# Patient Record
Sex: Female | Born: 1955
Health system: Southern US, Community
[De-identification: ages and names within clinical notes are randomized; demographics above are authoritative.]

## PROBLEM LIST (undated history)

## (undated) DIAGNOSIS — M1990 Unspecified osteoarthritis, unspecified site: Secondary | ICD-10-CM

## (undated) DIAGNOSIS — E8881 Metabolic syndrome: Secondary | ICD-10-CM

## (undated) DIAGNOSIS — I10 Essential (primary) hypertension: Secondary | ICD-10-CM

## (undated) DIAGNOSIS — R7303 Prediabetes: Secondary | ICD-10-CM

## (undated) DIAGNOSIS — E785 Hyperlipidemia, unspecified: Secondary | ICD-10-CM

## (undated) DIAGNOSIS — T7840XA Allergy, unspecified, initial encounter: Secondary | ICD-10-CM

## (undated) DIAGNOSIS — A6004 Herpesviral vulvovaginitis: Secondary | ICD-10-CM

## (undated) DIAGNOSIS — R55 Syncope and collapse: Principal | ICD-10-CM

## (undated) DIAGNOSIS — M7731 Calcaneal spur, right foot: Secondary | ICD-10-CM

## (undated) DIAGNOSIS — J309 Allergic rhinitis, unspecified: Secondary | ICD-10-CM

## (undated) DIAGNOSIS — M8580 Other specified disorders of bone density and structure, unspecified site: Secondary | ICD-10-CM

## (undated) DIAGNOSIS — M722 Plantar fascial fibromatosis: Secondary | ICD-10-CM

## (undated) HISTORY — DX: Herpesviral vulvovaginitis: A60.04

## (undated) HISTORY — DX: Allergic rhinitis, unspecified: J30.9

## (undated) HISTORY — PX: OTHER SURGICAL HISTORY: SHX169

## (undated) HISTORY — PX: COLONOSCOPY: SHX174

## (undated) HISTORY — DX: Other specified disorders of bone density and structure, unspecified site: M85.80

## (undated) HISTORY — DX: Allergy, unspecified, initial encounter: T78.40XA

## (undated) HISTORY — DX: Calcaneal spur, right foot: M77.31

## (undated) HISTORY — PX: ABDOMINAL HYSTERECTOMY: SHX81

## (undated) HISTORY — DX: Unspecified osteoarthritis, unspecified site: M19.90

## (undated) HISTORY — DX: Prediabetes: R73.03

## (undated) HISTORY — DX: Plantar fascial fibromatosis: M72.2

## (undated) HISTORY — DX: Syncope and collapse: R55

## (undated) HISTORY — DX: Metabolic syndrome: E88.81

## (undated) HISTORY — PX: JOINT REPLACEMENT: SHX530

## (undated) HISTORY — DX: Hyperlipidemia, unspecified: E78.5

## (undated) HISTORY — PX: TOTAL HIP ARTHROPLASTY: SHX124

## (undated) HISTORY — DX: Metabolic syndrome: E88.810

## (undated) HISTORY — DX: Essential (primary) hypertension: I10

---

## 1997-12-23 ENCOUNTER — Other Ambulatory Visit: Admission: RE | Admit: 1997-12-23 | Discharge: 1997-12-23 | Payer: Self-pay | Admitting: Obstetrics and Gynecology

## 1998-12-30 ENCOUNTER — Other Ambulatory Visit: Admission: RE | Admit: 1998-12-30 | Discharge: 1998-12-30 | Payer: Self-pay | Admitting: Obstetrics and Gynecology

## 2001-02-08 ENCOUNTER — Emergency Department (HOSPITAL_COMMUNITY): Admission: EM | Admit: 2001-02-08 | Discharge: 2001-02-08 | Payer: Self-pay | Admitting: Emergency Medicine

## 2001-05-28 ENCOUNTER — Other Ambulatory Visit: Admission: RE | Admit: 2001-05-28 | Discharge: 2001-05-28 | Payer: Self-pay | Admitting: Obstetrics and Gynecology

## 2005-10-25 ENCOUNTER — Ambulatory Visit (HOSPITAL_COMMUNITY): Admission: RE | Admit: 2005-10-25 | Discharge: 2005-10-26 | Payer: Self-pay | Admitting: Obstetrics & Gynecology

## 2005-10-25 ENCOUNTER — Encounter (INDEPENDENT_AMBULATORY_CARE_PROVIDER_SITE_OTHER): Payer: Self-pay | Admitting: Specialist

## 2006-03-03 ENCOUNTER — Encounter: Admission: RE | Admit: 2006-03-03 | Discharge: 2006-03-03 | Payer: Self-pay | Admitting: Orthopedic Surgery

## 2006-05-23 ENCOUNTER — Encounter: Admission: RE | Admit: 2006-05-23 | Discharge: 2006-05-23 | Payer: Self-pay | Admitting: Internal Medicine

## 2006-10-03 ENCOUNTER — Encounter: Admission: RE | Admit: 2006-10-03 | Discharge: 2007-01-01 | Payer: Self-pay | Admitting: Internal Medicine

## 2007-05-14 ENCOUNTER — Emergency Department (HOSPITAL_COMMUNITY): Admission: EM | Admit: 2007-05-14 | Discharge: 2007-05-14 | Payer: Self-pay | Admitting: Emergency Medicine

## 2008-02-19 ENCOUNTER — Ambulatory Visit: Payer: Self-pay | Admitting: Internal Medicine

## 2008-03-24 ENCOUNTER — Ambulatory Visit: Payer: Self-pay | Admitting: Internal Medicine

## 2008-04-16 ENCOUNTER — Encounter: Admission: RE | Admit: 2008-04-16 | Discharge: 2008-04-16 | Payer: Self-pay | Admitting: Allergy and Immunology

## 2008-06-09 ENCOUNTER — Ambulatory Visit: Payer: Self-pay | Admitting: Internal Medicine

## 2008-12-30 ENCOUNTER — Ambulatory Visit: Payer: Self-pay | Admitting: Internal Medicine

## 2009-01-16 ENCOUNTER — Ambulatory Visit (HOSPITAL_COMMUNITY): Admission: RE | Admit: 2009-01-16 | Discharge: 2009-01-16 | Payer: Self-pay | Admitting: Orthopedic Surgery

## 2009-01-20 ENCOUNTER — Ambulatory Visit: Payer: Self-pay | Admitting: Internal Medicine

## 2009-03-09 ENCOUNTER — Ambulatory Visit: Payer: Self-pay | Admitting: Internal Medicine

## 2010-01-01 ENCOUNTER — Ambulatory Visit: Payer: Self-pay | Admitting: Internal Medicine

## 2010-07-21 ENCOUNTER — Emergency Department (HOSPITAL_COMMUNITY): Payer: Managed Care, Other (non HMO)

## 2010-07-21 ENCOUNTER — Emergency Department (HOSPITAL_COMMUNITY)
Admission: EM | Admit: 2010-07-21 | Discharge: 2010-07-21 | Disposition: A | Discharge status: Home / Self Care | Payer: Managed Care, Other (non HMO) | Attending: Emergency Medicine | Admitting: Emergency Medicine

## 2010-07-21 DIAGNOSIS — E785 Hyperlipidemia, unspecified: Secondary | ICD-10-CM | POA: Insufficient documentation

## 2010-07-21 DIAGNOSIS — R079 Chest pain, unspecified: Secondary | ICD-10-CM | POA: Insufficient documentation

## 2010-07-21 DIAGNOSIS — M25519 Pain in unspecified shoulder: Secondary | ICD-10-CM | POA: Insufficient documentation

## 2010-07-21 DIAGNOSIS — R61 Generalized hyperhidrosis: Secondary | ICD-10-CM | POA: Insufficient documentation

## 2010-07-21 DIAGNOSIS — M542 Cervicalgia: Secondary | ICD-10-CM | POA: Insufficient documentation

## 2010-07-21 DIAGNOSIS — R110 Nausea: Secondary | ICD-10-CM | POA: Insufficient documentation

## 2010-07-21 LAB — BASIC METABOLIC PANEL
BUN: 19 mg/dL (ref 6–23)
GFR calc Af Amer: >60 mL/min (ref >60)
GFR calc non Af Amer: >60 mL/min (ref >60)
Potassium: 3.8 mEq/L (ref 3.5–5.1)
Sodium: 135 mEq/L (ref 135–145)

## 2010-07-21 LAB — POCT CARDIAC MARKERS
CKMB, poc: 1.8 ng/mL (ref 1.0–8.0)
Troponin i, poc: <0.05 ng/mL (ref 0.00–0.09)

## 2010-07-21 LAB — CBC
HCT: 39.5 % (ref 36.0–46.0)
Hemoglobin: 12.8 g/dL (ref 12.0–15.0)
MCV: 91.6 fL (ref 78.0–100.0)
Platelets: 313 10*3/uL (ref 150–400)
RBC: 4.31 MIL/uL (ref 3.87–5.11)
WBC: 11 10*3/uL — ABNORMAL HIGH (ref 4.0–10.5)

## 2010-07-21 LAB — DIFFERENTIAL
Lymphocytes Relative: 38 % (ref 12–46)
Lymphs Abs: 4.2 10*3/uL — ABNORMAL HIGH (ref 0.7–4.0)
Neutro Abs: 5.6 10*3/uL (ref 1.7–7.7)
Neutrophils Relative %: 50 % (ref 43–77)

## 2010-07-27 ENCOUNTER — Encounter (INDEPENDENT_AMBULATORY_CARE_PROVIDER_SITE_OTHER): Payer: Managed Care, Other (non HMO) | Admitting: Internal Medicine

## 2010-07-27 DIAGNOSIS — E785 Hyperlipidemia, unspecified: Secondary | ICD-10-CM

## 2010-07-27 DIAGNOSIS — Z23 Encounter for immunization: Secondary | ICD-10-CM

## 2010-07-27 DIAGNOSIS — E559 Vitamin D deficiency, unspecified: Secondary | ICD-10-CM

## 2010-07-27 DIAGNOSIS — E119 Type 2 diabetes mellitus without complications: Secondary | ICD-10-CM

## 2010-08-13 LAB — COMPREHENSIVE METABOLIC PANEL
AST: 23 U/L (ref 0–37)
Albumin: 4.2 g/dL (ref 3.5–5.2)
Alkaline Phosphatase: 100 U/L (ref 39–117)
BUN: 12 mg/dL (ref 6–23)
Chloride: 100 mEq/L (ref 96–112)
Potassium: 4 mEq/L (ref 3.5–5.1)
Total Bilirubin: 0.4 mg/dL (ref 0.3–1.2)

## 2010-08-13 LAB — CBC
HCT: 39.3 % (ref 36.0–46.0)
Platelets: 340 10*3/uL (ref 150–400)
WBC: 11.4 10*3/uL — ABNORMAL HIGH (ref 4.0–10.5)

## 2010-08-13 LAB — URINALYSIS, ROUTINE W REFLEX MICROSCOPIC
Bilirubin Urine: NEGATIVE
Glucose, UA: NEGATIVE mg/dL
Specific Gravity, Urine: 1.023 (ref 1.005–1.030)
pH: 5.5 (ref 5.0–8.0)

## 2010-08-13 LAB — PROTIME-INR: INR: 0.9 (ref 0.00–1.49)

## 2010-08-13 LAB — URINE MICROSCOPIC-ADD ON

## 2010-09-13 ENCOUNTER — Ambulatory Visit (INDEPENDENT_AMBULATORY_CARE_PROVIDER_SITE_OTHER): Payer: Managed Care, Other (non HMO) | Admitting: Internal Medicine

## 2010-09-13 DIAGNOSIS — L259 Unspecified contact dermatitis, unspecified cause: Secondary | ICD-10-CM

## 2010-09-13 DIAGNOSIS — J069 Acute upper respiratory infection, unspecified: Secondary | ICD-10-CM

## 2010-09-21 ENCOUNTER — Telehealth: Payer: Self-pay

## 2010-09-21 NOTE — Telephone Encounter (Signed)
Rx: Hycodan 8 oz one tsp po Q6 hours prn cough called in to [facility] per MD order.

## 2010-09-21 NOTE — Telephone Encounter (Signed)
Please call in Hycodan 8 0z one tsp po q 6 hours prn cough

## 2010-09-24 NOTE — Discharge Summary (Signed)
NAMESHAKHIA, Carla [name]               ACCOUNT NO.:  [id number]   MEDICAL RECORD NO.:  [id number]          PATIENT TYPE:  OIB   LOCATION:  9302                          FACILITY:  [facility]   PHYSICIAN:  Freddy Finner, M.D.   DATE OF BIRTH:  Aug 08, 1955   DATE OF ADMISSION:  10/25/2005  DATE OF DISCHARGE:  10/26/2005                                 DISCHARGE SUMMARY   DISCHARGE DIAGNOSES:  1.  Uterine leiomyomata.  2.  Complex left adnexal mass, pathology pending.  3.  Endometrial polyp, pathology pending.   OPERATIVE PROCEDURE:  Laparoscopic-assisted vaginal hysterectomy, bilateral  salpingo-oophorectomy.   INTRAOPERATIVE/POSTOPERATIVE COMPLICATIONS:  None.   DISPOSITION:  Patient is in satisfactory, improved condition at the time of  her discharge.  She is to have progressively increasing physical activity  but no vaginal entry, no heavy lifting.  She is to report fever, severe  pain, or heavy bleeding.   DISCHARGE MEDICATIONS:  Motrin 600 mg every 6 hours as needed for  postoperative pain, which can be used along with Darvocet-N 100 1-2 every 4-  6 hours as needed for postoperative pain.  She is to continue with her  Vivelle transdermal estrogen system, which is 0.05 mg.   She is to return to the office in two weeks for postoperative followup.   She is to avoid vaginal entry, heavy lifting, or heavy physical work.  She  is to report fever, severe pain, or heavy bleeding.   For details of the present of the illness, past obstetric history, review of  systems, physical exam, refer to the admission note.   Her physical findings on admission were primarily remarkable for fibroids  and for ultrasound findings which were documented in the present illness.   Laboratory data during this admission includes an admission hemoglobin of  12.6, postoperative hemoglobin of 10.7, normal prothrombin time, PTT, and  INR on admission.   HOSPITAL COURSE:  The patient was admitted on the morning of  surgery,  treated perioperatively with IV antibiotics and with antiembolic compression  hose.  The above-described surgical procedure was accomplished without  difficulty or intraoperative complications.  Her postoperative course was  uncomplicated.  She remained  afebrile throughout her postoperative stay.  By the evening of the first  postoperative day, she was ambulating without difficulty.  She was having  adequate bowel and bladder function.  Her incisions were clean, dry, and  intact.  She was discharged home with disposition as noted above.      Freddy Finner, M.D.  Electronically Signed     WRN/MEDQ  D:  10/26/2005  T:  10/26/2005  Job:  [id number]

## 2010-09-24 NOTE — H&P (Signed)
NAMEMarland Kitchen  Carla [name], Carla [name] NO.:  [id number]   MEDICAL RECORD NO.:  [id number]         PATIENT TYPE:  WOIB   LOCATION:                                FACILITY:  [facility]   PHYSICIAN:  Freddy Finner, M.D.        DATE OF BIRTH:   DATE OF ADMISSION:  DATE OF DISCHARGE:                                HISTORY & PHYSICAL   ADMISSION DIAGNOSES:  1.  Uterine fibroids.  2.  Persistent complex left adnexal mass, most likely dermoid cystic ovary.  3.  Endometrial polyp diagnosed by sonohistogram.   The patient is a 55 year old white married female nulligravida, who  presented to the office first in September 2006 with a history of irregular  cycles, prolonged intramenstrual spotting.  Subsequent to that examination,  she had a sonohistogram in the office which revealed a small 7 mm  endometrial polyp and numerous uterine leiomyomata, the largest measuring  approximately 3.5 cm.  In addition, TSH was obtained showing a normal  thyroid.  Her pelvic examination revealed mild enlargement of her uterus on  initial exam, and this was confirmed by ultrasound.  Over the course of that  time, options of therapy had been discussed  with her, and she has elected  to proceed with definitive surgery, specifically, laparoscopic-assisted  vaginal hysterectomy and bilateral salpingo-oophorectomy.  The option of  hysteroscopy D&C  and NovaSure endometrial ablation was offered and refused.   REVIEW OF SYSTEMS:  Her current Review of Systems is otherwise negative.  No  cardiopulmonary, GI, GU complaints.   PAST MEDICAL HISTORY:  The patient is noted to have irritable bowel  syndrome, has had recent negative colonoscopy with Dr. Ritta Slot.   PREVIOUS OPERATIVE PROCEDURES:  Include left hip replacement in 1999.  She  had hysteroscopy D&C in 2001.  No other operative procedures.   Her only drug allergy is CODEINE.   Last known mammogram was normal in December 2005.  Last known Pap test was  normal in October 2005.  No history of abnormal Pap.   FAMILY HISTORY:  Noncontributory.   PHYSICAL EXAMINATION:  HEENT: Grossly within normal limits.  NECK:  Thyroid gland is not palpably enlarged.  VITAL SIGNS:  Blood pressure in the office was 124/74.  The patient is obese  at 5 feet 1-3/4 inches and 180 pounds.  CHEST: Clear to auscultation.  HEART: Normal sinus rhythm without murmur, rub, or gallop.  BREASTS:  Exam is normal.  No dominant masses, no skin changes, no  retractions.  ABDOMEN: Soft and nontender.  There is no appreciable organomegaly.  EXTREMITIES:  Without cyanosis, clubbing, or edema.  PELVIC: External genitalia, vagina, and cervix are normal to inspection.  Bimanual reveals uterus to be anterior and 12 to 14-week size.  There are no  palpable adnexal masses. Exam is confirmed by body habitus and size of the  uterus.  RECTAL:  Rectum is palpably normal.  Rectovaginal exam confirms the above  findings.   IMPRESSION:  1.  Numerous uterine leiomyomata  2.  Dysfunctional uterine bleeding.  3.  Endometrial polyp.  4.  Complex left adnexal mass.      Freddy Finner, M.D.  Electronically Signed     WRN/MEDQ  D:  10/24/2005  T:  10/24/2005  Job:  [id number]

## 2010-09-24 NOTE — Op Note (Signed)
NAMEANAB, VIVAR               ACCOUNT NO.:  [id number]   MEDICAL RECORD NO.:  [id number]          PATIENT TYPE:  OIB   LOCATION:  9302                          FACILITY:  [facility]   PHYSICIAN:  Freddy Finner, M.D.   DATE OF BIRTH:  [birth year]   DATE OF PROCEDURE:  10/25/2005  DATE OF DISCHARGE:                                 OPERATIVE REPORT   PREOPERATIVE DIAGNOSES:  Fibroids, endometrial polyp, dysfunction uterine  bleeding.   POSTOPERATIVE DIAGNOSES:  Fibroids, endometrial polyp, dysfunction uterine  bleeding.   OPERATIVE PROCEDURE:  Laparoscopically assisted vaginal hysterectomy,  bilateral salpingo-oophorectomy.   SURGEON:  Freddy Finner, M.D.   ASSISTANT:  Stann Mainland. Vincente Poli, M.D.   ESTIMATED INTRAOPERATIVE BLOOD LOSS:  200 cc.   ANESTHESIA:  General endotracheal.   COMPLICATIONS:  None.   The details of the present illness are recorded in the admission note.  The  patient was admitted on the morning of surgery.  She was given an IV  antibiotic bolus preoperatively.  She was placed in antiembolic compression  hose.  She was brought to the operating room and placed under adequate  general anesthesia, placed in the dorsal lithotomy position with care to  control the abduction of her hips for the previous hip replacement.  The  abdomen, perineum, and vagina were prepped with Betadine scrub, followed by  Betadine solution.  The bladder was evacuated with a Robinson catheter with  a sterile technique.  A Hulka tenaculum was attached to the cervix under  direct visualization.  Sterile drapes were applied.  Two small incisions  were made, one at the umbilicus and one just above the symphysis.  Through  the upper incision, an 11 mm nonbloody disposable trocar was introduced,  following the anterior abdominal wall manually.  Direct inspection revealed  adequate placement with no evidence of injury on entry.  Pneumoperitoneum  was allowed to accumulate with carbon dioxide  gas.  A 5 mm trocar was placed  under direct visualization through the lower incision.  Through it, spring-  loaded grasping forceps, a blunt probe, and later the Nezhat irrigation  system was used.  Systemic examination of pelvic and abdominal contents was  carried out.  The uterus was significantly enlarged and irregular nodule,  consistent with preoperative ultrasound findings.  Tubes and ovaries appear  to be normal.  The complex adnexal cyst previously noted was not easily  visible, and the ovaries were left intact during the dissection.  The  appendix was partially visualized and appeared to be retrocecal but normal.  There was no apparently abnormality of the upper abdomen.  The tripolar  gyrus device was then used through the operating channel of the laparoscope.  The infundibulopelvic ligament, round ligament, upper broad ligament was  progressively developed, sealed, and divided using the bipolar coagulation  and the blade in the device.  This dissection was carried basically down to  the level of the uterine arteries.  The bladder was released anteriorly from  the cervix.  The cervix and uterus were somewhat distorted on the lower  segment because of the  presence of fibroids.  Close dissection along the  margin was successful without injury to other structures.  Attention was  then turned vaginally.  The patient is nulliparous.  The vault was very  narrow.  Using appropriate Deavers and the __________ slender posterior  weighted retractor.  The cul de sac was entered to search, was circumscribed  with a scalpel, and using the gyrus, a Heaney-style clamp, progressive  pedicles were developed with bipolar coagulation and sharp division.  The  uterosacrals were taken.  Bladder pillars were taken.  The bladder was very  carefully advanced further off the cervix.  The cardinal ligament pedicles  were taken, sealed, and divided, as were the uterine artery pedicles.  This  allowed  delivery of the uterus through the vaginal introitus without further  significant difficulty.  There was no active bleeding except from the cuff.  Angles of the vagina were anchored to the uterosacrals with a mattress  suture of 0 Monocryl.  Uterosacrals were plicated, and posterior peritoneum  were closed with an interrupted 0 Monocryl suture.  The cuff was carefully  closed with figure-of-eights of 0 Monocryl in a vertical direction.  A Foley  catheter was placed.  Indigo carmine had been given IV, and there was prompt  drainage of a large amount of bloody urine.  Spillage of urine was noted,  either vaginally on reinspection, and laparoscopically.  Reinspection  laparoscopically was carried out using a Nezhat system, and complete  hemostasis was noted.  Photographs were made before and after removal of the  uterus and were retained in the office records.  The irrigating solution was  aspirated from the abdomen.  Hemostasis remained complete under reduced  intra-abdominal pressure.  The gas was allowed to escape from the abdomen.  All instruments were removed.  The skin incisions were anesthetized with  0.25% plain Marcaine.  The skin incisions were closed with interrupted  subcuticular sutures with 3-0 Dexon.  Steri-Strips were applied to the lower  incision.  The patient tolerated the procedure well.  She was awakened and  taken to recovery in good condition.      Freddy Finner, M.D.  Electronically Signed     WRN/MEDQ  D:  10/25/2005  T:  10/25/2005  Job:  [id number]

## 2010-10-27 LAB — HM COLONOSCOPY

## 2010-11-01 ENCOUNTER — Ambulatory Visit: Payer: Managed Care, Other (non HMO) | Admitting: Internal Medicine

## 2010-11-23 ENCOUNTER — Encounter: Payer: Self-pay | Admitting: Internal Medicine

## 2010-11-23 ENCOUNTER — Ambulatory Visit (INDEPENDENT_AMBULATORY_CARE_PROVIDER_SITE_OTHER): Payer: Managed Care, Other (non HMO) | Admitting: Internal Medicine

## 2010-11-23 DIAGNOSIS — M1990 Unspecified osteoarthritis, unspecified site: Secondary | ICD-10-CM

## 2010-11-23 DIAGNOSIS — E669 Obesity, unspecified: Secondary | ICD-10-CM

## 2010-11-23 DIAGNOSIS — E785 Hyperlipidemia, unspecified: Secondary | ICD-10-CM

## 2010-11-23 DIAGNOSIS — E119 Type 2 diabetes mellitus without complications: Secondary | ICD-10-CM

## 2010-11-23 DIAGNOSIS — J069 Acute upper respiratory infection, unspecified: Secondary | ICD-10-CM

## 2010-11-23 DIAGNOSIS — J309 Allergic rhinitis, unspecified: Secondary | ICD-10-CM

## 2010-11-23 DIAGNOSIS — E559 Vitamin D deficiency, unspecified: Secondary | ICD-10-CM

## 2010-11-23 MED ORDER — AZITHROMYCIN 250 MG PO TABS: ORAL_TABLET | ORAL | Status: AC

## 2010-12-04 ENCOUNTER — Encounter: Payer: Self-pay | Admitting: Internal Medicine

## 2010-12-04 DIAGNOSIS — E559 Vitamin D deficiency, unspecified: Secondary | ICD-10-CM | POA: Insufficient documentation

## 2010-12-04 DIAGNOSIS — E785 Hyperlipidemia, unspecified: Secondary | ICD-10-CM | POA: Insufficient documentation

## 2010-12-04 DIAGNOSIS — E119 Type 2 diabetes mellitus without complications: Secondary | ICD-10-CM | POA: Insufficient documentation

## 2010-12-04 DIAGNOSIS — J309 Allergic rhinitis, unspecified: Secondary | ICD-10-CM | POA: Insufficient documentation

## 2010-12-04 DIAGNOSIS — E669 Obesity, unspecified: Secondary | ICD-10-CM | POA: Insufficient documentation

## 2010-12-04 DIAGNOSIS — M1990 Unspecified osteoarthritis, unspecified site: Secondary | ICD-10-CM | POA: Insufficient documentation

## 2010-12-04 NOTE — Progress Notes (Signed)
  Subjective:    Patient ID: Carla [name], female    DOB: Sep 19, 1955, 55 y.o.   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 [place]. Yesterday came in with sore throat and respiratory congestion. Prior history of allergic rhinitis but she feels that this is an acute illness and she has a low-grade fever. Has malaise and fatigue. Has appointment soon for evaluation for surgical clearance for hip replacement. Currently diet controlled diabetes. Has not been able to lose weight because of orthopedic issues with exercise. Previous weight August 2010 was 193 pounds and in March 2012 was 183 pounds. In March, hemoglobin A1c was 6.6% and vitamin D level was 11. She was placed on high-dose vitamin D 50,000 units weekly for 12 weeks.    Review of Systems     Objective:   Physical Exam pharynx slightly injected. Sounds nasally congested. TMs slightly full bilaterally. Neck is supple without adenopathy or thyromegaly. Chest clear.        Assessment & Plan:   Upper respiratory infection likely viral etiology: Have her patient has diabetes  I'm going to give her Zithromax Z-Pak (6 tablets) 2 tablets day one followed by 1 tablet days 2 through 5. Samples of Zutipro cough syrup 1 teaspoon at bedtime as needed.

## 2010-12-06 ENCOUNTER — Encounter: Payer: Self-pay | Admitting: Internal Medicine

## 2010-12-06 ENCOUNTER — Ambulatory Visit (INDEPENDENT_AMBULATORY_CARE_PROVIDER_SITE_OTHER): Payer: Managed Care, Other (non HMO) | Admitting: Internal Medicine

## 2010-12-06 DIAGNOSIS — Z0000 Encounter for general adult medical examination without abnormal findings: Secondary | ICD-10-CM

## 2010-12-06 DIAGNOSIS — E785 Hyperlipidemia, unspecified: Secondary | ICD-10-CM

## 2010-12-06 DIAGNOSIS — E559 Vitamin D deficiency, unspecified: Secondary | ICD-10-CM

## 2010-12-06 DIAGNOSIS — E119 Type 2 diabetes mellitus without complications: Secondary | ICD-10-CM

## 2010-12-06 LAB — HEPATIC FUNCTION PANEL
AST: 18 U/L (ref 0–37)
Alkaline Phosphatase: 102 U/L (ref 39–117)
Bilirubin, Direct: 0.1 mg/dL (ref 0.0–0.3)
Total Bilirubin: 0.4 mg/dL (ref 0.3–1.2)

## 2010-12-06 MED ORDER — SITAGLIPTIN PHOSPHATE 100 MG PO TABS: 100.0000 mg | ORAL_TABLET | Freq: Every day | ORAL | Status: DC

## 2010-12-06 MED ORDER — MONTELUKAST SODIUM 10 MG PO TABS: 10.0000 mg | ORAL_TABLET | Freq: Every day | ORAL | Status: DC

## 2010-12-06 MED ORDER — ATORVASTATIN CALCIUM 20 MG PO TABS: 20.0000 mg | ORAL_TABLET | Freq: Every day | ORAL | Status: DC

## 2010-12-06 MED ORDER — RAMIPRIL 2.5 MG PO CAPS: 2.5000 mg | ORAL_CAPSULE | Freq: Every day | ORAL | Status: DC

## 2010-12-06 NOTE — Progress Notes (Signed)
Subjective:    Patient ID: Carla [name], female    DOB: 1955-06-20, 55 y.o.   MRN: [id number]  HPI  55 year old white female with history of diabetes mellitus facing right hip replacement surgery in early October. She is overweight. Finds it hard to exercise because of hip pain. Has run out of Januvia. History of allergic rhinitis for which she takes Singulair 10 mg daily. History of hyperlipidemia treated with Lipitor 20 mg daily. She has had Pneumovax immunization September 2010. Tetanus immunization February 2012. Discussed with her today noncompliance with diabetic management. Husband is also a diabetic. They like to snack at night. Blames her weight gain and lack of exercise but some of it is dietary indiscretion as well. Also history of vitamin D deficiency. She took high dose vitamin D supplement for a while but did not followup with daily vitamin D over-the-counter. Have recommended to thousand units daily over-the-counter. Needs to take calcium supplement as well.  History of HSV type II and condyloma acuminata. History of fractured right wrist 1990. History of partial phalangectomy right second toe 1990, endometrial polypectomy, D. and C. 1998; arthroscopic debridement of left hip spring 1999 [facility] left hip replacement November 1999. Total abdominal hysterectomy, BSO for fibroids and right dermoid cyst 2007. Right hip replacement 2010. Had colonoscopy by Dr. met often 2006. Bone density study in 2000 [place]. Gets annual influenza immunization. Occasionally uses Pulmicort but not on a daily basis 2 puffs by mouth twice daily. Sees Dr. Jennette Kettle for GYN care and gets mammogram through his office.  In March 2012, total cholesterol was 214, triglycerides 153, LDL cholesterol 130 on Lipitor 20 mg nightly. TSH was normal. Hemoglobin A1c was 6.6%. Vitamin D level was 11 and that is when she was placed on 12 weeks of high-dose vitamin D therapy. Vitamin D level will be repeated  today along with hemoglobin A1c and fasting lipid panel.  Discussed with her today taking small dose of Altase 2.5 mg daily for renal protection with history of diabetes mellitus. Her blood pressures under good control. Urine needs to be sent for microalbumin. Reminded about diabetic eye exam.  Had episode of chest pain seen in emergency department March 2012. Used to wear Vivelle patch per GYN physician but has stopped this.  Does not smoke. Social alcohol consumption.  Father died with complications of Parkinson's disease. Mother in fairly good health. One brother in good health. Half sister in good health.    Review of Systems  Constitutional: Negative.   HENT: Negative.   Eyes: Negative.   Respiratory: Negative.   Cardiovascular: Negative.   Gastrointestinal: Negative.   Genitourinary: Negative.   Musculoskeletal: Negative.   Neurological: Negative.   Hematological: Negative.   Psychiatric/Behavioral: Negative.        Objective:   Physical Exam  Constitutional: She is oriented to person, place, and time.  HENT:  Head: Normocephalic and atraumatic.  Right Ear: External ear normal.  Left Ear: External ear normal.  Mouth/Throat: Oropharynx is clear and moist. No oropharyngeal exudate.  Eyes: EOM are normal. Pupils are equal, round, and reactive to light. Right eye exhibits no discharge. Left eye exhibits no discharge. No scleral icterus.  Neck: Neck supple. No JVD present. No thyromegaly present.  Cardiovascular: Regular rhythm and intact distal pulses.   No murmur heard. Pulmonary/Chest: Effort normal and breath sounds normal. No respiratory distress. She has no wheezes. She has no rales. She exhibits no tenderness.  Abdominal: Soft. Bowel sounds  are normal. She exhibits mass. She exhibits no distension. There is no tenderness. There is no rebound and no guarding.  Lymphadenopathy:    She has no cervical adenopathy.  Neurological: She is alert and oriented to person,  place, and time. She has normal reflexes.  Skin: No rash noted.  Psychiatric: She has a normal mood and affect. Her behavior is normal. Judgment and thought content normal.          Assessment & Plan:   Diabetes mellitus  Hyperlipidemia  Obesity  Osteoarthritis right hip with upcoming hip replacement scheduled early October  Allergic rhinitis  History of vitamin D deficiency  Refill Januvia 100 mg daily for one year, refill Singulair 10 mg daily for one year, start Altace 2.5 mg daily for renal protection, refill Lipitor 20 mg daily generic for one year. Patient should return in 4 months for followup with hemoglobin A1c and office visit. Should get influenza immunization late September before her surgery.

## 2010-12-07 ENCOUNTER — Encounter: Payer: Self-pay | Admitting: Internal Medicine

## 2010-12-07 LAB — VITAMIN D 25 HYDROXY (VIT D DEFICIENCY, FRACTURES): Vit D, 25-Hydroxy: 46 ng/mL (ref 30–89)

## 2010-12-07 LAB — HEMOGLOBIN A1C: Hgb A1c MFr Bld: 6.3 % — ABNORMAL HIGH (ref <5.7)

## 2011-01-27 ENCOUNTER — Ambulatory Visit (INDEPENDENT_AMBULATORY_CARE_PROVIDER_SITE_OTHER): Payer: Managed Care, Other (non HMO) | Admitting: Internal Medicine

## 2011-01-27 ENCOUNTER — Encounter: Payer: Self-pay | Admitting: Internal Medicine

## 2011-01-27 VITALS — Temp 98.7°F

## 2011-01-27 DIAGNOSIS — Z23 Encounter for immunization: Secondary | ICD-10-CM

## 2011-01-27 MED ORDER — HYDROCODONE-ACETAMINOPHEN 5-500 MG PO TABS: 1.0000 | ORAL_TABLET | Freq: Four times a day (QID) | ORAL | Status: AC | PRN

## 2011-02-08 ENCOUNTER — Other Ambulatory Visit: Payer: Self-pay | Admitting: Orthopedic Surgery

## 2011-02-08 ENCOUNTER — Ambulatory Visit (HOSPITAL_COMMUNITY)
Admission: RE | Admit: 2011-02-08 | Discharge: 2011-02-08 | Disposition: A | Discharge status: Home / Self Care | Payer: Managed Care, Other (non HMO) | Source: Ambulatory Visit | Attending: Orthopedic Surgery | Admitting: Orthopedic Surgery

## 2011-02-08 ENCOUNTER — Other Ambulatory Visit (HOSPITAL_COMMUNITY): Payer: Self-pay | Admitting: Orthopedic Surgery

## 2011-02-08 ENCOUNTER — Encounter (HOSPITAL_COMMUNITY): Payer: Managed Care, Other (non HMO)

## 2011-02-08 DIAGNOSIS — Z01818 Encounter for other preprocedural examination: Secondary | ICD-10-CM

## 2011-02-08 DIAGNOSIS — M169 Osteoarthritis of hip, unspecified: Secondary | ICD-10-CM | POA: Insufficient documentation

## 2011-02-08 DIAGNOSIS — Z01812 Encounter for preprocedural laboratory examination: Secondary | ICD-10-CM | POA: Insufficient documentation

## 2011-02-08 DIAGNOSIS — Z96649 Presence of unspecified artificial hip joint: Secondary | ICD-10-CM | POA: Insufficient documentation

## 2011-02-08 DIAGNOSIS — M1610 Unilateral primary osteoarthritis, unspecified hip: Secondary | ICD-10-CM | POA: Insufficient documentation

## 2011-02-08 LAB — URINALYSIS, ROUTINE W REFLEX MICROSCOPIC
Glucose, UA: NEGATIVE mg/dL
Ketones, ur: NEGATIVE mg/dL
Specific Gravity, Urine: 1.024 (ref 1.005–1.030)
pH: 5.5 (ref 5.0–8.0)

## 2011-02-08 LAB — COMPREHENSIVE METABOLIC PANEL
AST: 20 U/L (ref 0–37)
BUN: 15 mg/dL (ref 6–23)
CO2: 30 mEq/L (ref 19–32)
Calcium: 10.1 mg/dL (ref 8.4–10.5)
Chloride: 99 mEq/L (ref 96–112)
Creatinine, Ser: 0.74 mg/dL (ref 0.50–1.10)
GFR calc Af Amer: >90 mL/min (ref >90)
GFR calc non Af Amer: >90 mL/min (ref >90)
Glucose, Bld: 86 mg/dL (ref 70–99)
Total Bilirubin: 0.3 mg/dL (ref 0.3–1.2)

## 2011-02-08 LAB — URINE MICROSCOPIC-ADD ON

## 2011-02-08 LAB — CBC
Hemoglobin: 12.1 g/dL (ref 12.0–15.0)
MCH: 28.9 pg (ref 26.0–34.0)
MCV: 90.4 fL (ref 78.0–100.0)
RBC: 4.18 MIL/uL (ref 3.87–5.11)
WBC: 9.8 10*3/uL (ref 4.0–10.5)

## 2011-02-08 LAB — APTT: aPTT: 31 seconds (ref 24–37)

## 2011-02-08 LAB — SURGICAL PCR SCREEN
MRSA, PCR: NEGATIVE
Staphylococcus aureus: NEGATIVE

## 2011-02-14 ENCOUNTER — Inpatient Hospital Stay (HOSPITAL_COMMUNITY)
Admission: RE | Admit: 2011-02-14 | Discharge: 2011-02-17 | DRG: 470 | Disposition: A | Discharge status: Home Health | Payer: Managed Care, Other (non HMO) | Source: Ambulatory Visit | Attending: Orthopedic Surgery | Admitting: Orthopedic Surgery

## 2011-02-14 ENCOUNTER — Inpatient Hospital Stay (HOSPITAL_COMMUNITY): Payer: Managed Care, Other (non HMO)

## 2011-02-14 DIAGNOSIS — M1610 Unilateral primary osteoarthritis, unspecified hip: Principal | ICD-10-CM | POA: Diagnosis present

## 2011-02-14 DIAGNOSIS — E7800 Pure hypercholesterolemia, unspecified: Secondary | ICD-10-CM | POA: Diagnosis present

## 2011-02-14 DIAGNOSIS — Z791 Long term (current) use of non-steroidal anti-inflammatories (NSAID): Secondary | ICD-10-CM

## 2011-02-14 DIAGNOSIS — E119 Type 2 diabetes mellitus without complications: Secondary | ICD-10-CM | POA: Diagnosis present

## 2011-02-14 DIAGNOSIS — Z96649 Presence of unspecified artificial hip joint: Secondary | ICD-10-CM

## 2011-02-14 DIAGNOSIS — Z01812 Encounter for preprocedural laboratory examination: Secondary | ICD-10-CM

## 2011-02-14 DIAGNOSIS — Z79899 Other long term (current) drug therapy: Secondary | ICD-10-CM

## 2011-02-14 DIAGNOSIS — M169 Osteoarthritis of hip, unspecified: Principal | ICD-10-CM | POA: Diagnosis present

## 2011-02-14 LAB — TYPE AND SCREEN

## 2011-02-14 LAB — ABO/RH: ABO/RH(D): O POS

## 2011-02-14 LAB — GLUCOSE, CAPILLARY: Glucose-Capillary: 111 mg/dL — ABNORMAL HIGH (ref 70–99)

## 2011-02-15 LAB — CBC
MCHC: 33 g/dL (ref 30.0–36.0)
MCV: 90.1 fL (ref 78.0–100.0)
Platelets: 270 10*3/uL (ref 150–400)
RDW: 13.8 % (ref 11.5–15.5)
WBC: 9.9 10*3/uL (ref 4.0–10.5)

## 2011-02-15 LAB — BASIC METABOLIC PANEL
Chloride: 99 mEq/L (ref 96–112)
Creatinine, Ser: 0.53 mg/dL (ref 0.50–1.10)
GFR calc Af Amer: >90 mL/min (ref >90)
GFR calc non Af Amer: >90 mL/min (ref >90)
Potassium: 4 mEq/L (ref 3.5–5.1)

## 2011-02-15 LAB — GLUCOSE, CAPILLARY
Glucose-Capillary: 87 mg/dL (ref 70–99)
Glucose-Capillary: 140 mg/dL — ABNORMAL HIGH (ref 70–99)

## 2011-02-16 ENCOUNTER — Inpatient Hospital Stay (HOSPITAL_COMMUNITY): Payer: Managed Care, Other (non HMO)

## 2011-02-16 LAB — BASIC METABOLIC PANEL
BUN: 9 mg/dL (ref 6–23)
CO2: 29 mEq/L (ref 19–32)
Chloride: 100 mEq/L (ref 96–112)
Creatinine, Ser: 0.55 mg/dL (ref 0.50–1.10)
Glucose, Bld: 145 mg/dL — ABNORMAL HIGH (ref 70–99)
Potassium: 3.7 mEq/L (ref 3.5–5.1)

## 2011-02-16 LAB — CBC
HCT: 31.6 % — ABNORMAL LOW (ref 36.0–46.0)
Hemoglobin: 10.1 g/dL — ABNORMAL LOW (ref 12.0–15.0)
MCV: 90.5 fL (ref 78.0–100.0)
RDW: 13.9 % (ref 11.5–15.5)
WBC: 14.5 10*3/uL — ABNORMAL HIGH (ref 4.0–10.5)

## 2011-02-16 LAB — URINALYSIS, ROUTINE W REFLEX MICROSCOPIC
Nitrite: NEGATIVE
Specific Gravity, Urine: 1.01 (ref 1.005–1.030)
Urobilinogen, UA: 0.2 mg/dL (ref 0.0–1.0)
pH: 5.5 (ref 5.0–8.0)

## 2011-02-16 LAB — URINE MICROSCOPIC-ADD ON

## 2011-02-16 LAB — GLUCOSE, CAPILLARY
Glucose-Capillary: 137 mg/dL — ABNORMAL HIGH (ref 70–99)
Glucose-Capillary: 133 mg/dL — ABNORMAL HIGH (ref 70–99)

## 2011-02-16 NOTE — Op Note (Signed)
Carla [name], Carla [name]               ACCOUNT NO.:  [id number]  MEDICAL RECORD NO.:  [id number]  LOCATION:  1613                         FACILITY:  [facility]  PHYSICIAN:  Ollen Gross, M.D.    DATE OF BIRTH:  06-13-1955  DATE OF PROCEDURE:  02/14/2011 DATE OF DISCHARGE:                              OPERATIVE REPORT   PREOPERATIVE DIAGNOSIS:  Osteoarthritis, right hip.  POSTOPERATIVE DIAGNOSIS:  Osteoarthritis, right hip.  PROCEDURE:  Right total hip arthroplasty.  SURGEON:  Ollen Gross, M.D.  ASSISTANT:  Alexzandrew L. Perkins, P.A.C.  ANESTHESIA:  Spinal.  ESTIMATED BLOOD LOSS:  100.  DRAINS:  Hemovac x1.  FINDINGS:  Bone-on-bone osteoarthritis, right hip with hypertrophic acetabular labrum and a large osteophyte formation.  COMPLICATIONS:  None.  CONDITION:  Stable to recovery.  BRIEF CLINICAL INDICATION:  Carla [name] is a 55 year old female with end-stage osteoarthritic changes of the right hip with progressively worsening pain and dysfunction.  She has failed nonoperative management and presents for total hip arthroplasty.  She has bone-on-bone arthritis. She had a previous successful left total hip.  PROCEDURE IN DETAIL:  After successful administration of spinal anesthetic, the patient was placed in the left lateral decubitus position with the right side up and held with the hip positioner.  Right lower extremity was isolated from her perineum with plastic drapes and prepped and draped in the usual sterile fashion.  Short posterolateral incision was made with a 10-blade through the subcutaneous tissue through the level of fascia lata, which was incised in line with the skin incision.  The sciatic nerve was palpated and protected and short rotators and capsule were isolated off the femur.  The hip was dislocated and the center of femoral head was marked.  There was exposed bone all throughout the femoral head with large osteophyte formation. The center of femoral head  was marked and a trial prosthesis was placed such that the center of the trial head corresponds to the center of her native femoral head.  Osteotomy line was marked on the femoral neck and osteotomy made with an oscillating saw.  The femoral head was removed. Retractors were then placed around the proximal femur to gain access to the canal.  The starter reamer was passed into the femoral canal and the canal was thoroughly irrigated to remove fatty contents.  Reaming was then performed with the axial reamers up to 11.5 mm, proximal reaming to a 16- D and the sleeve machined to a small.  A 16-D small trial sleeve was placed.  The femur was retracted anteriorly to gain acetabular exposure. Acetabular retractors were placed and the large hypertrophic labrum and osteophytes were removed.  Reaming was performed up to 49 mm for placement of a 50 mm Pinnacle acetabular shell.  It was placed in an anatomic position with excellent purchase.  I did not need any additional dome screws.  Apex hole eliminator was placed and the 32 mm neutral +4 marathon liner was placed.  Trial femur was then placed which is a 16 x 11 with a 36 + 6 neck matching native anteversion.  The 32+ 0 trial head was placed.  The hips were reduced with just a tiny bit  of laxity with reduction. I went to a 32 + 3 which had more appropriate soft tissue tension.  There was excellent stability with full extension, full external rotation, 70 degrees flexion, 40 degrees adduction, 90 degrees of internal rotation, and 90 degrees of flexion with 70 degrees of internal rotation.  By placing the right leg on top of the left, I felt as though the leg lengths were equal.  The hip was then dislocated and trials were removed.  Permanent 16-D small sleeve was placed with a 16 x 11 stem and a 36 + 6 neck matching native anteversion.  The 32 + 3 ceramic head was then placed and hips reduced with the same stability parameters.  The wound was  then copiously irrigated with saline solution and the short rotators and capsule reattached to the femur through drill holes with Ethibond suture.  Fascia lata was closed over Hemovac drain with interrupted #1 Vicryl.  A mixture of 20 mL Exparel with 50 mL of saline was then injected into the fascia lata and gluteal muscles.  It was also injected into the subcu tissues.  Subcu was then closed with interrupted #1 and interrupted 2-0 Vicryl, and subcuticular closed with a running 4-0 Monocryl.  Drain was hooked to suction.  Incision was cleaned and dried and Steri-Strips and a bulky sterile dressing applied. She was then placed into a knee immobilizer, awakened, and transported to recovery in stable condition.  Please note that it was a medical necessity for a surgical assistant in this procedure to help perform it in a safe and expeditious manner. Surgical assistant was needed to retract vital neurovascular structures and to position the leg in a manner to allow for anatomic placement of the prosthesis.  Retraction was necessary throughout the case to safely prepare the femur and acetabulum.     Ollen Gross, M.D.     FA/MEDQ  D:  02/14/2011  T:  02/15/2011  Job:  [id number]  Electronically Signed by Ollen Gross M.D. on 02/16/2011 11:23:52 AM

## 2011-02-17 LAB — CBC
HCT: 30.1 % — ABNORMAL LOW (ref 36.0–46.0)
Hemoglobin: 9.9 g/dL — ABNORMAL LOW (ref 12.0–15.0)
MCH: 30.1 pg (ref 26.0–34.0)
MCHC: 32.9 g/dL (ref 30.0–36.0)
MCV: 91.5 fL (ref 78.0–100.0)
Platelets: 271 K/uL (ref 150–400)
RBC: 3.29 MIL/uL — ABNORMAL LOW (ref 3.87–5.11)
RDW: 14.1 % (ref 11.5–15.5)
WBC: 14.2 K/uL — ABNORMAL HIGH (ref 4.0–10.5)

## 2011-02-17 LAB — URINE CULTURE
Colony Count: 50000
Culture  Setup Time: 201210110237

## 2011-02-17 LAB — GLUCOSE, CAPILLARY
Glucose-Capillary: 104 mg/dL — ABNORMAL HIGH (ref 70–99)
Glucose-Capillary: 87 mg/dL (ref 70–99)

## 2011-02-18 NOTE — H&P (Signed)
NAMEKARLITA, [name]               ACCOUNT NO.:  [id number]  MEDICAL RECORD NO.:  [id number]  LOCATION:                                 FACILITY:  PHYSICIAN:  Ollen Gross, M.D.    DATE OF BIRTH:  [birth year]  DATE OF ADMISSION:  02/14/2011 DATE OF DISCHARGE:                             HISTORY & PHYSICAL   DATE OF ADMISSION:  February 14, 2011  CHIEF COMPLAINT:  Right hip pain.  HISTORY OF PRESENT ILLNESS:  The patient is a 55 year old female who has been seen by Dr. Lequita Halt for ongoing right hip pain.  Carla [name] has had problems over the past several years now that is progressively, gotten worse over the past year.  Today, it is at a point where it is hurting all the time.  Her history is complicated by a femur fracture with limited weightbearing for six weeks.  Carla [name] initially did well, but now the hip is starting to hurt.  Carla [name] is at a stage now where it is hurting all the time and interfering with activities.  Carla [name] is seen in the office, which has bone-on-bone arthritis in the hip joint.  It is felt Carla [name] would benefit from undergoing surgical intervention.  Risks and benefits have been and Carla [name] agreed to proceed with surgery.  There are no ongoing contraindications such as progressive neurological disease or ongoing infection.  ALLERGIES:  CODEINE causes hives.  Please note that Carla [name] is able to take Vicodin or hydrocodone.  PAST MEDICAL HISTORY:  Diabetes and hypercholesterolemia.  PAST SURGICAL HISTORY:  Left total hip arthroplasty, hysterectomy, removal of bone spurs from right great, wisdom tooth extraction (please note the patient did state Carla [name] had a intraoperative fracture during her previous left total hip).  FAMILY HISTORY:  Parkinson's, Alzheimer's, bone cancer with her father deceased at age 71.  FAMILY HISTORY:  Significant for Parkinson's disease and mother is living at 3, with also Parkinson's and osteoporosis.  CURRENT MEDICATIONS:  Januvia, Lipitor, phentermine,  Singulair, Vicodin, Aleve, and calcium.  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 Carla [name] is married.  REVIEW OF SYSTEMS:  GENERAL:  No fevers, chills, or night sweats. NEURO:  No seizure, syncope, or paralysis.  RESPIRATORY:  No shortness breath, productive cough, or hemoptysis.  CARDIOVASCULAR:  No chest pain or orthopnea.  GI:  No nausea, vomiting, diarrhea, or constipation.  GU: No dysuria, hematuria, or discharge.  MUSCULOSKELETAL:  Hip pain.  PHYSICAL EXAMINATION:  VITAL SIGNS:  Pulse 68, respirations 12, blood pressure 158/86. GENERAL:  The patient is a 55 year old white female, well nourished, well developed, in no acute distress.  Carla [name] is alert, oriented, and cooperative, mild distress secondary to pain. HEENT:  Normocephalic and atraumatic.  Pupils are equal, round, and reactive.  EOMs intact. NECK:  Supple.  No carotid bruits. CHEST:  Clear anterior and posterior chest walls.  No rhonchi, rales, or wheezing. HEART:  Regular rate and rhythm without murmur, S1 and S2 noted.ABDOMEN:  Soft and nontender.  Bowel sounds present. RECTAL/BREAST/GENITALIA:  Not done as not part of present illness. EXTREMITIES:  Right hip flexion 90, 0 internal rotation, 10 degrees external rotation, and 30  degrees abduction.  Left hip flexion 100, internal rotation 20, external rotation 30, and abduction 30.  IMPRESSION:  Osteoarthritis, right hip.  PLAN:  The patient will be admitted to [facility] to undergo right total knee replacement arthroplasty.  Surgery will be performed by Ollen Gross.  Carla [name] does want to go home, but will be willing to look into inpatient rehab if needed.     Alexzandrew L. Julien Girt, P.A.C.   ______________________________ Ollen Gross, M.D.    ALP/MEDQ  D:  02/13/2011  T:  02/14/2011  Job:  [id number]  Electronically Signed by Patrica Duel P.A.C. on 02/18/2011 06:42:34 AM Electronically Signed by Ollen Gross M.D. on  02/18/2011 04:26:47 PM

## 2011-03-02 NOTE — Discharge Summary (Signed)
NAMEJENIN, Carla [name]               ACCOUNT NO.:  [id number]  MEDICAL RECORD NO.:  [id number]  LOCATION:  1613                         FACILITY:  [facility]  PHYSICIAN:  Ollen Gross, M.D.    DATE OF BIRTH:  02-18-1956  DATE OF ADMISSION:  02/14/2011 DATE OF DISCHARGE:  02/17/2011                              DISCHARGE SUMMARY   ADMITTING DIAGNOSES: 1. Osteoarthritis, right hip. 2. Diabetes. 3. Hypercholesterolemia.  DISCHARGE DIAGNOSES: 1. Osteoarthritis, right hip; status post right total hip replacement     arthroplasty. 2. Mild postoperative acute blood loss anemia. 3. Diabetes. 4. Hypercholesterolemia.  PROCEDURE:  February 14, 2011, right total hip.  SURGEON:  Ollen Gross, MD  ASSISTANT:  Alexzandrew L. Perkins, PA-C.  ANESTHESIA:  Spinal anesthesia.  CONSULTS:  None.  BRIEF HISTORY:  The patient is a 55 year old female with end-stage arthritis of the right hip, progressive worsening pain and dysfunction, failed operative management, now presents for total hip arthroplasty.  LABORATORY DATA:  Admission CBC is not scanned in the chart, but the postop hemoglobin was down to 10.2, it had been drifted down to 10.1. Last hemoglobin and hematocrit are 9.9 and 30.1.  Chem panel on admission not scanned in the chart.  Serial BMETs were followed for 48 hours.  Electrolytes remained within normal limits.  Glucose was 129 on day 1 and 145 on day 2.  Followup UA; moderate hemoglobin, trace leukocytes, few epithelials, 36 white cells, and rare bacteria.  Urine culture, multiple bacterial morphotypes, none predominant.  X-RAYS:  Two-view chest dated February 16, 2011; negative except for suggestion of trace pleural effusions.  Postop hip and pelvis films, satisfactory positions of right total hip arthroplasty.  HOSPITAL COURSE:  The patient was admitted to [facility], taken to OR, underwent above-stated procedure without complication.  The patient tolerated the  procedure well, later transferred from recovery room to orthopedic floor, started on p.o. and IV analgesics.  Actually had a little bit of pain through the night, but doing better on the morning of day 1, had decent urinary output.  Labs looked good.  Her Hemovac drain was pulled.  She was started back on oral hypoglycemics, but sliding scale was added.  Started getting up out of bed.  Partial weightbearing 25% to 50%.  She was doing pretty well on the morning of day 2, started getting up with therapy.  Hemoglobin was stable.  She had some elevated temperature.  So UA and chest x-ray were taken, stated as above.  Continued to progress well and by day 3, her temperature was back down.  The incision looked good.  We changed the dressing on day 2 and day 3, healing well.  She is tolerating her meds, started to meet her goals.  She was discharged home on day 3.  DISCHARGE PLAN: 1. The patient discharged home on February 17, 2011. 2. Discharge diagnoses, please see above. 3. Discharge meds:  Cipro, hydromorphone, Robaxin, Xarelto.  Continue     Januvia, Lipitor, Singulair. 4. Diet:  Diabetic diet, heart-healthy diet. 5. Activity:  Partial weightbearing of 25% to 50%, right leg.  Hip     precautions total protocol. 6. Follow up in 2  weeks.  DISPOSITION:  Home.  CONDITION ON DISCHARGE:  Improving.     Alexzandrew L. Julien Girt, P.A.C.   ______________________________ Ollen Gross, M.D.    ALP/MEDQ  D:  02/27/2011  T:  02/27/2011  Job:  [id number]  Electronically Signed by Patrica Duel P.A.C. on 02/28/2011 04:23:35 PM Electronically Signed by Ollen Gross M.D. on 03/02/2011 11:24:26 AM

## 2011-04-22 ENCOUNTER — Ambulatory Visit (INDEPENDENT_AMBULATORY_CARE_PROVIDER_SITE_OTHER): Payer: Managed Care, Other (non HMO) | Admitting: Internal Medicine

## 2011-04-22 ENCOUNTER — Encounter: Payer: Self-pay | Admitting: Internal Medicine

## 2011-04-22 VITALS — BP 130/70 | HR 88 | Temp 99.6°F | Resp 20 | Wt 181.0 lb

## 2011-04-22 DIAGNOSIS — J329 Chronic sinusitis, unspecified: Secondary | ICD-10-CM

## 2011-04-22 DIAGNOSIS — E119 Type 2 diabetes mellitus without complications: Secondary | ICD-10-CM

## 2011-05-09 ENCOUNTER — Encounter: Payer: Self-pay | Admitting: Internal Medicine

## 2011-05-09 NOTE — Progress Notes (Signed)
  Subjective:    Patient ID: Carla [name], female    DOB: February 04, 1956, 55 y.o.   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 Patient has diabetes and is overweight. Has had some discolored nasal drainage cough and congestion. No fever or chills.    Review of Systems     Objective:   Physical Exam HEENT exam: TMs slightly full bilaterally; pharynx only slightly injected; neck is supple without adenopathy; chest is clear        Assessment & Plan:  Sinusitis  Plan: Zithromax Z-Pak take 2 tablets by mouth day one followed by 1 tablet by mouth days 2 through 5. Hycodan 8 ounces 1 teaspoon by mouth every 6 hours when necessary cough.

## 2011-05-09 NOTE — Patient Instructions (Signed)
Take Zithromax Z-PAK as prescribed. Use Hycodan sparingly for cough.

## 2011-05-30 ENCOUNTER — Encounter: Payer: Self-pay | Admitting: Internal Medicine

## 2011-05-30 ENCOUNTER — Ambulatory Visit (INDEPENDENT_AMBULATORY_CARE_PROVIDER_SITE_OTHER): Payer: Managed Care, Other (non HMO) | Admitting: Internal Medicine

## 2011-05-30 DIAGNOSIS — E785 Hyperlipidemia, unspecified: Secondary | ICD-10-CM

## 2011-05-30 DIAGNOSIS — M7918 Myalgia, other site: Secondary | ICD-10-CM

## 2011-05-30 DIAGNOSIS — J309 Allergic rhinitis, unspecified: Secondary | ICD-10-CM

## 2011-05-30 DIAGNOSIS — E559 Vitamin D deficiency, unspecified: Secondary | ICD-10-CM

## 2011-05-30 DIAGNOSIS — E669 Obesity, unspecified: Secondary | ICD-10-CM

## 2011-05-30 DIAGNOSIS — F411 Generalized anxiety disorder: Secondary | ICD-10-CM

## 2011-05-30 DIAGNOSIS — M1990 Unspecified osteoarthritis, unspecified site: Secondary | ICD-10-CM

## 2011-05-30 DIAGNOSIS — IMO0001 Reserved for inherently not codable concepts without codable children: Secondary | ICD-10-CM

## 2011-05-30 DIAGNOSIS — E1165 Type 2 diabetes mellitus with hyperglycemia: Secondary | ICD-10-CM

## 2011-05-30 DIAGNOSIS — F419 Anxiety disorder, unspecified: Secondary | ICD-10-CM

## 2011-05-30 DIAGNOSIS — E119 Type 2 diabetes mellitus without complications: Secondary | ICD-10-CM

## 2011-05-30 LAB — HEMOGLOBIN A1C
Hgb A1c MFr Bld: 6.3 % — ABNORMAL HIGH (ref <5.7)
Mean Plasma Glucose: 134 mg/dL — ABNORMAL HIGH (ref <117)

## 2011-05-30 NOTE — Patient Instructions (Signed)
Please try to be compliant with checking Accu-Cheks at least once daily. Try the diet exercise and lose weight. Return in 6 months. Xanax has been prescribed to use sparingly for anxiety.

## 2011-05-30 NOTE — Progress Notes (Signed)
  Subjective:    Patient ID: Carla [name], female    DOB: [birth year], 56 y.o.   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 Patient had left hip replacement 1999, right hip replacement September 2010. Repeat left hip replacement October 2012. Is doing well post hip replacement. Has not been compliant recently with Lipitor on a daily basis and lipid panel was not checked. Not checking Accu-Cheks daily either. This is disconcerting. Husband has been diagnosed with cirrhosis and she's stressed out with financial concerns et Karie Soda. Unable to exercise because of hip problems. Recently joined [employer]. Says she had an eye exam 2012 by Dr. Aura Camps. No diabetic foot problems. Also has been having some musculoskeletal pain in the parascapular areas bilaterally. Started when she had hip replacement surgery. Seems to come and go it is a "spasm".    Review of Systems     Objective:   Physical Exam no thyromegaly; chest clear to auscultation; cardiac exam regular rate and rhythm extremities no diabetic foot ulcers. Pulses are normal. No lower extremity edema. No calluses. Parascapular tenderness bilaterally medial scapular areas        Assessment & Plan:  Adult-onset diabetes mellitus  Hyperlipidemia-noncompliant with Lipitor  History of vitamin D deficiency  History of allergic rhinitis  Anxiety  Musculoskeletal pain  Plan: Hemoglobin A1c drawn. Defer lipid panel since she's not been compliant with Lipitor. Return in 6 months at which time she'll meet physical examination, fasting lab work including lipid panel and hemoglobin A1c. In July 2012 I placed her on an ACE inhibitor, she also was placed on Lipitor 20 mg daily at that time and Januvia 100 mg daily.  For anxiety gave her Xanax 0.5 mg (#60) one half to one by mouth twice daily as needed for anxiety with no refills

## 2011-06-10 ENCOUNTER — Other Ambulatory Visit: Payer: Self-pay

## 2011-06-10 MED ORDER — MONTELUKAST SODIUM 10 MG PO TABS: 10.0000 mg | ORAL_TABLET | Freq: Every day | ORAL | Status: DC

## 2011-07-11 ENCOUNTER — Telehealth: Payer: Self-pay | Admitting: Internal Medicine

## 2011-07-11 NOTE — Telephone Encounter (Signed)
OK to refill generic Singulair for one year to pharmacy

## 2011-07-12 ENCOUNTER — Other Ambulatory Visit: Payer: Self-pay

## 2011-07-12 MED ORDER — MONTELUKAST SODIUM 10 MG PO TABS: 10.0000 mg | ORAL_TABLET | Freq: Every day | ORAL | Status: DC

## 2011-07-12 NOTE — Telephone Encounter (Signed)
Rx for Singulair sent to [facility] pharmacy

## 2011-08-31 ENCOUNTER — Other Ambulatory Visit: Payer: Self-pay

## 2011-08-31 ENCOUNTER — Telehealth: Payer: Self-pay | Admitting: Internal Medicine

## 2011-08-31 MED ORDER — ATORVASTATIN CALCIUM 20 MG PO TABS: 20.0000 mg | ORAL_TABLET | Freq: Every day | ORAL | Status: DC

## 2011-08-31 MED ORDER — SITAGLIPTIN PHOSPHATE 100 MG PO TABS: 100.0000 mg | ORAL_TABLET | Freq: Every day | ORAL | Status: DC

## 2011-08-31 NOTE — Telephone Encounter (Signed)
These can be called in with 6 month refills.

## 2011-12-19 ENCOUNTER — Other Ambulatory Visit: Payer: Managed Care, Other (non HMO) | Admitting: Internal Medicine

## 2011-12-19 DIAGNOSIS — E119 Type 2 diabetes mellitus without complications: Secondary | ICD-10-CM

## 2011-12-19 DIAGNOSIS — E785 Hyperlipidemia, unspecified: Secondary | ICD-10-CM

## 2011-12-19 DIAGNOSIS — Z0000 Encounter for general adult medical examination without abnormal findings: Secondary | ICD-10-CM

## 2011-12-19 LAB — COMPREHENSIVE METABOLIC PANEL
Albumin: 4.3 g/dL (ref 3.5–5.2)
Alkaline Phosphatase: 106 U/L (ref 39–117)
BUN: 15 mg/dL (ref 6–23)
CO2: 30 mEq/L (ref 19–32)
Calcium: 9.7 mg/dL (ref 8.4–10.5)
Chloride: 102 mEq/L (ref 96–112)
Glucose, Bld: 87 mg/dL (ref 70–99)
Potassium: 4.2 mEq/L (ref 3.5–5.3)
Sodium: 140 mEq/L (ref 135–145)
Total Protein: 7 g/dL (ref 6.0–8.3)

## 2011-12-19 LAB — LIPID PANEL
Cholesterol: 183 mg/dL (ref 0–200)
HDL: 55 mg/dL (ref >39)
LDL Cholesterol: 108 mg/dL — ABNORMAL HIGH (ref 0–99)
Triglycerides: 101 mg/dL (ref <150)

## 2011-12-19 LAB — CBC WITH DIFFERENTIAL/PLATELET
HCT: 40.4 % (ref 36.0–46.0)
Hemoglobin: 13.1 g/dL (ref 12.0–15.0)
Lymphocytes Relative: 30 % (ref 12–46)
Lymphs Abs: 2.5 10*3/uL (ref 0.7–4.0)
MCHC: 32.4 g/dL (ref 30.0–36.0)
Monocytes Absolute: 0.7 10*3/uL (ref 0.1–1.0)
Monocytes Relative: 9 % (ref 3–12)
Neutro Abs: 4.9 10*3/uL (ref 1.7–7.7)
Neutrophils Relative %: 58 % (ref 43–77)
RBC: 4.5 MIL/uL (ref 3.87–5.11)

## 2011-12-19 LAB — TSH: TSH: 2.177 u[IU]/mL (ref 0.350–4.500)

## 2011-12-20 LAB — VITAMIN D 25 HYDROXY (VIT D DEFICIENCY, FRACTURES): Vit D, 25-Hydroxy: 28 ng/mL — ABNORMAL LOW (ref 30–89)

## 2011-12-22 ENCOUNTER — Encounter: Payer: Self-pay | Admitting: Internal Medicine

## 2011-12-22 ENCOUNTER — Ambulatory Visit (INDEPENDENT_AMBULATORY_CARE_PROVIDER_SITE_OTHER): Payer: Managed Care, Other (non HMO) | Admitting: Internal Medicine

## 2011-12-22 VITALS — BP 130/76 | HR 88 | Temp 99.2°F | Ht 61.25 in | Wt 166.0 lb

## 2011-12-22 DIAGNOSIS — E785 Hyperlipidemia, unspecified: Secondary | ICD-10-CM

## 2011-12-22 DIAGNOSIS — Z1211 Encounter for screening for malignant neoplasm of colon: Secondary | ICD-10-CM

## 2011-12-22 DIAGNOSIS — E669 Obesity, unspecified: Secondary | ICD-10-CM

## 2011-12-22 DIAGNOSIS — M8580 Other specified disorders of bone density and structure, unspecified site: Secondary | ICD-10-CM

## 2011-12-22 DIAGNOSIS — M899 Disorder of bone, unspecified: Secondary | ICD-10-CM

## 2011-12-22 DIAGNOSIS — E8881 Metabolic syndrome: Secondary | ICD-10-CM

## 2011-12-22 DIAGNOSIS — E119 Type 2 diabetes mellitus without complications: Secondary | ICD-10-CM

## 2011-12-22 DIAGNOSIS — Z0000 Encounter for general adult medical examination without abnormal findings: Secondary | ICD-10-CM

## 2011-12-22 LAB — POCT URINALYSIS DIPSTICK
Bilirubin, UA: NEGATIVE
Blood, UA: NEGATIVE
Glucose, UA: NEGATIVE
Leukocytes, UA: NEGATIVE
Nitrite, UA: NEGATIVE
Urobilinogen, UA: NEGATIVE

## 2011-12-22 NOTE — Patient Instructions (Addendum)
Continue same meds and return in 6 months. Diet and exercise

## 2011-12-26 ENCOUNTER — Telehealth: Payer: Self-pay | Admitting: Internal Medicine

## 2011-12-26 NOTE — Telephone Encounter (Signed)
Sp w/patient and advised of MJB's message regarding shopping around for cheaper prices.  States Karin Golden is 30 days for $3.99 and 90 days for $9.99.  Again just advised patient that per MJB, she's on the cheapest and best meds at present.  Pt will do her homework and see what she can find.

## 2011-12-26 NOTE — Telephone Encounter (Signed)
I asked her to contact [facility] or [facility].  There is no other drug similar to Singulair. I believe Lipitor generic can be obtained fairly cheap at other drug stores. She will need to check around. We discussed all of this at her visit. She is on the cheapest best meds already. She just needs to find a better price.

## 2011-12-27 ENCOUNTER — Ambulatory Visit (AMBULATORY_SURGERY_CENTER): Payer: Managed Care, Other (non HMO)

## 2011-12-27 VITALS — Ht 61.0 in | Wt 165.6 lb

## 2011-12-27 DIAGNOSIS — Z1211 Encounter for screening for malignant neoplasm of colon: Secondary | ICD-10-CM

## 2011-12-27 MED ORDER — MOVIPREP 100 G PO SOLR: ORAL | Status: DC

## 2011-12-27 NOTE — Progress Notes (Signed)
Pt came into the office for her pre-visit prior to her colonoscopy with Dr Russella Dar on 01/05/12.She states she had a colonoscopy done in 2007 by Dr Kinnie Scales.She did not know the results of the colonoscopy.A medical release form was filled out and given to [facility].

## 2012-01-05 ENCOUNTER — Encounter: Payer: Self-pay | Admitting: Gastroenterology

## 2012-01-05 ENCOUNTER — Ambulatory Visit (AMBULATORY_SURGERY_CENTER): Payer: Managed Care, Other (non HMO) | Admitting: Gastroenterology

## 2012-01-05 VITALS — BP 136/67 | HR 92 | Temp 99.0°F | Resp 16 | Ht 61.0 in | Wt 165.0 lb

## 2012-01-05 DIAGNOSIS — D126 Benign neoplasm of colon, unspecified: Secondary | ICD-10-CM

## 2012-01-05 DIAGNOSIS — Z1211 Encounter for screening for malignant neoplasm of colon: Secondary | ICD-10-CM

## 2012-01-05 LAB — GLUCOSE, CAPILLARY: Glucose-Capillary: 91 mg/dL (ref 70–99)

## 2012-01-05 MED ORDER — SODIUM CHLORIDE 0.9 % IV SOLN: 500.0000 mL | INTRAVENOUS | Status: DC

## 2012-01-05 NOTE — Progress Notes (Signed)
Patient did not experience any of the following events: a burn prior to discharge; a fall within the facility; wrong site/side/patient/procedure/implant event; or a hospital transfer or hospital admission upon discharge from the facility. (G8907) Patient did not have preoperative order for IV antibiotic SSI prophylaxis. (G8918)  

## 2012-01-05 NOTE — Progress Notes (Signed)
Propofol per m smith crna, all meds titrated  Per crna during proecdure. See scanned intra procedure report. ewm

## 2012-01-05 NOTE — Patient Instructions (Addendum)
YOU HAD AN ENDOSCOPIC PROCEDURE TODAY AT THE [facility]: Refer to the procedure report that was given to you for any specific questions about what was found during the examination.  If the procedure report does not answer your questions, please call your gastroenterologist to clarify.  If you requested that your care partner not be given the details of your procedure findings, then the procedure report has been included in a sealed envelope for you to review at your convenience later.  YOU SHOULD EXPECT: Some feelings of bloating in the abdomen. Passage of more gas than usual.  Walking can help get rid of the air that was put into your GI tract during the procedure and reduce the bloating. If you had a lower endoscopy (such as a colonoscopy or flexible sigmoidoscopy) you may notice spotting of blood in your stool or on the toilet paper. If you underwent a bowel prep for your procedure, then you may not have a normal bowel movement for a few days.  DIET: Your first meal following the procedure should be a light meal and then it is ok to progress to your normal diet.  A half-sandwich or bowl of soup is an example of a good first meal.  Heavy or fried foods are harder to digest and may make you feel nauseous or bloated.  Likewise meals heavy in dairy and vegetables can cause extra gas to form and this can also increase the bloating.  Drink plenty of fluids but you should avoid alcoholic beverages for 24 hours.  ACTIVITY: Your care partner should take you home directly after the procedure.  You should plan to take it easy, moving slowly for the rest of the day.  You can resume normal activity the day after the procedure however you should NOT DRIVE or use heavy machinery for 24 hours (because of the sedation medicines used during the test).    SYMPTOMS TO REPORT IMMEDIATELY: A gastroenterologist can be reached at any hour.  During normal business hours, 8:30 AM to 5:00 PM Monday through Friday,  call [phone].  After hours and on weekends, please call the GI answering service at [phone] who will take a message and have the physician on call contact you.   Following lower endoscopy (colonoscopy or flexible sigmoidoscopy):  Excessive amounts of blood in the stool  Significant tenderness or worsening of abdominal pains  Swelling of the abdomen that is new, acute  Fever of 100F or higher  FOLLOW UP: If any biopsies were taken you will be contacted by phone or by letter within the next 1-3 weeks.  Call your gastroenterologist if you have not heard about the biopsies in 3 weeks.  Our staff will call the home number listed on your records the next business day following your procedure to check on you and address any questions or concerns that you may have at that time regarding the information given to you following your procedure. This is a courtesy call and so if there is no answer at the home number and we have not heard from you through the emergency physician on call, we will assume that you have returned to your regular daily activities without incident.  SIGNATURES/CONFIDENTIALITY: You and/or your care partner have signed paperwork which will be entered into your electronic medical record.  These signatures attest to the fact that that the information above on your After Visit Summary has been reviewed and is understood.  Full responsibility of the confidentiality of this   discharge information lies with you and/or your care-partner.    Resume medications. Information given on polyps with discharge instructions. 

## 2012-01-05 NOTE — Op Note (Signed)
[facility] [address] [place], [address]   COLONOSCOPY PROCEDURE REPORT  PATIENT: Carla, [name]  MR#: [id number] BIRTHDATE: 01/09/1956 , 56  yrs. old GENDER: Female ENDOSCOPIST: Meryl Dare, MD, [facility] REFERRED AO:ZHYQ Waymond Cera, M.D. PROCEDURE DATE:  01/05/2012 PROCEDURE:   Colonoscopy with snare polypectomy ASA CLASS:   Class II INDICATIONS: average risk screening. MEDICATIONS: MAC sedation, administered by CRNA and propofol (Diprivan) 200mg  IV DESCRIPTION OF PROCEDURE:   After the risks benefits and alternatives of the procedure were thoroughly explained, informed consent was obtained.  A digital rectal exam revealed no abnormalities of the rectum.   The LB [id number]-[id number]  endoscope was introduced through the anus and advanced to the cecum, which was identified by both the appendix and ileocecal valve. No adverse events experienced.   The quality of the prep was excellent, using MoviPrep  The instrument was then slowly withdrawn as the colon was fully examined.   COLON FINDINGS: A sessile polyp measuring 6 mm in size was found in the rectum.  A polypectomy was performed with a cold snare.  The resection was complete and the polyp tissue was completely retrieved.   The colon mucosa was otherwise normal.  Retroflexed views revealed no abnormalities. The time to cecum=3 minutes 24 seconds.  Withdrawal time=11 minutes 25 seconds.  The scope was withdrawn and the procedure completed. COMPLICATIONS: There were no complications.  ENDOSCOPIC IMPRESSION: 1.   Sessile polyp in the rectum; polypectomy with a cold snare   RECOMMENDATIONS: 1.  Await pathology results 2.  Repeat colonoscopy in 5 years if polyp adenomatous; otherwise 10 years   eSigned:  Meryl Dare, MD, [facility] 01/05/2012 11:34 AM

## 2012-01-06 ENCOUNTER — Telehealth: Payer: Self-pay | Admitting: *Deleted

## 2012-01-06 NOTE — Telephone Encounter (Signed)
No message at # given.  Left message on voicemail.

## 2012-01-12 ENCOUNTER — Encounter: Payer: Self-pay | Admitting: Gastroenterology

## 2012-02-02 ENCOUNTER — Ambulatory Visit (INDEPENDENT_AMBULATORY_CARE_PROVIDER_SITE_OTHER): Payer: Managed Care, Other (non HMO) | Admitting: Internal Medicine

## 2012-02-02 DIAGNOSIS — Z23 Encounter for immunization: Secondary | ICD-10-CM

## 2012-02-03 DIAGNOSIS — Z23 Encounter for immunization: Secondary | ICD-10-CM

## 2012-04-19 ENCOUNTER — Encounter: Payer: Self-pay | Admitting: Internal Medicine

## 2012-04-19 ENCOUNTER — Ambulatory Visit (INDEPENDENT_AMBULATORY_CARE_PROVIDER_SITE_OTHER): Payer: Managed Care, Other (non HMO) | Admitting: Internal Medicine

## 2012-04-19 VITALS — BP 139/90 | HR 72 | Temp 99.0°F | Wt 166.0 lb

## 2012-04-19 DIAGNOSIS — J069 Acute upper respiratory infection, unspecified: Secondary | ICD-10-CM

## 2012-04-19 DIAGNOSIS — J329 Chronic sinusitis, unspecified: Secondary | ICD-10-CM

## 2012-04-19 MED ORDER — METHYLPREDNISOLONE ACETATE 80 MG/ML IJ SUSP
80.0000 mg | Freq: Once | INTRAMUSCULAR | Status: AC
  Administered 2012-04-19: 80 mg via INTRAMUSCULAR

## 2012-04-19 NOTE — Progress Notes (Signed)
  Subjective:    Patient ID: Carla [name], female    DOB: [birth year], 56 y.o.   MRN: [id number]  HPI 56 year old white female with history of obesity and type 2 diabetes mellitus in today with acute URI symptoms. 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 Has had a lot of postnasal drip and nasal congestion. History of allergic rhinitis. Ears are popping. No fever or shaking chills. Trouble sleeping because of respiratory congestion. No wheezing. No shortness of breath.    Review of Systems     Objective:   Physical Exam  TMs are full bilaterally but not red. Pharynx is clear. Sounds nasally congested. Conjunctivae are injected bilaterally. Neck is supple. Chest clear.        Assessment & Plan:  Acute URI  Bilateral serous otitis media  Type 2 diabetes mellitus  Plan: Depo-Medrol 80 mg IM. Zithromax Z-PAK take 2 tablets day one followed by 1 tablet days 2 through 5 with 1 refill. If not better in one week have prescription refilled

## 2012-04-19 NOTE — Patient Instructions (Addendum)
Take Zithromax Z-PAK as directed. Not better in 7 days have prescription refilled. You have been given Depo-Medrol 80 mg IM today.

## 2012-06-25 ENCOUNTER — Other Ambulatory Visit: Payer: BC Managed Care – PPO | Admitting: Internal Medicine

## 2012-06-25 ENCOUNTER — Encounter: Payer: Self-pay | Admitting: Internal Medicine

## 2012-06-25 ENCOUNTER — Ambulatory Visit (INDEPENDENT_AMBULATORY_CARE_PROVIDER_SITE_OTHER): Payer: BC Managed Care – PPO | Admitting: Internal Medicine

## 2012-06-25 VITALS — BP 142/80 | HR 92 | Temp 98.9°F | Wt 180.5 lb

## 2012-06-25 DIAGNOSIS — E119 Type 2 diabetes mellitus without complications: Secondary | ICD-10-CM

## 2012-06-25 DIAGNOSIS — E785 Hyperlipidemia, unspecified: Secondary | ICD-10-CM

## 2012-06-25 DIAGNOSIS — J309 Allergic rhinitis, unspecified: Secondary | ICD-10-CM

## 2012-06-25 DIAGNOSIS — R7301 Impaired fasting glucose: Secondary | ICD-10-CM

## 2012-06-25 DIAGNOSIS — Z79899 Other long term (current) drug therapy: Secondary | ICD-10-CM

## 2012-06-25 LAB — HEPATIC FUNCTION PANEL
Albumin: 4.4 g/dL (ref 3.5–5.2)
Alkaline Phosphatase: 87 U/L (ref 39–117)
Bilirubin, Direct: 0.1 mg/dL (ref 0.0–0.3)
Total Bilirubin: 0.5 mg/dL (ref 0.3–1.2)

## 2012-06-25 LAB — LIPID PANEL
HDL: 81 mg/dL (ref >39)
LDL Cholesterol: 106 mg/dL — ABNORMAL HIGH (ref 0–99)
Total CHOL/HDL Ratio: 2.6 Ratio

## 2012-06-25 LAB — HEMOGLOBIN A1C: Hgb A1c MFr Bld: 6.3 % — ABNORMAL HIGH (ref <5.7)

## 2012-06-25 MED ORDER — ATORVASTATIN CALCIUM 20 MG PO TABS: 20.0000 mg | ORAL_TABLET | Freq: Every day | ORAL | Status: DC

## 2012-06-25 MED ORDER — BUDESONIDE 0.25 MG/2ML IN SUSP: 0.2500 mg | Freq: Every day | RESPIRATORY_TRACT | Status: DC

## 2012-06-25 MED ORDER — MONTELUKAST SODIUM 10 MG PO TABS: 10.0000 mg | ORAL_TABLET | Freq: Every day | ORAL | Status: DC

## 2012-06-25 MED ORDER — RAMIPRIL 2.5 MG PO CAPS: 2.5000 mg | ORAL_CAPSULE | Freq: Every day | ORAL | Status: DC

## 2012-06-25 MED ORDER — SITAGLIPTIN PHOSPHATE 100 MG PO TABS: 100.0000 mg | ORAL_TABLET | Freq: Every day | ORAL | Status: DC

## 2012-06-25 NOTE — Progress Notes (Signed)
  Subjective:    Patient ID: Carla [name], female    DOB: 1956/03/01, 57 y.o.   MRN: [id number]  HPI  57 year White female for 6 month recheck.  History of AODM, Hyperlipidemia, allergic rhinitis.  Does not do accuchecks  Has not had eye exam in over a year. Does wear glasses.  Husband has chronic hepatitis C and cirrhosis. He is unable to work. She feels he can do more about the house and is frustrated. Has had some financial difficulties. She works as a Teacher, adult education. She recently got insurance or the affordable [place] to needs a number prescriptions refilled. Feels the Pulmicort helps her when she has bronchospasm. Doesn't want to have rescue inhaler such as Ventolin. Needs new prescription for Januvia. Also needs generic Lipitor refilled. Fasting lab work drawn and pending. Blood pressure is elevated today. She is supposed to be taking Ramipril 2.5 mg daily because of diabetes but has not been taking that. I've asked her to restart this    Review of Systems     Objective:   Physical Exam  Vitals reviewed. Constitutional: She appears well-developed and well-nourished.  Neck: Neck supple. No JVD present. No thyromegaly present.  Cardiovascular: Normal rate, regular rhythm, normal heart sounds and intact distal pulses.   No murmur heard. Pulmonary/Chest: Effort normal and breath sounds normal. No respiratory distress. She has no wheezes. She has no rales. She exhibits no tenderness.  Musculoskeletal: She exhibits no edema.  Lymphadenopathy:    She has no cervical adenopathy.  Skin: Skin is warm and dry. No rash noted.          Assessment & Plan:  Type 2 diabetes mellitus  Hyperlipidemia  Allergic rhinitis  Situational stress  Plan: Encourage patient to check Accu-Cheks 3 times weekly. Needs diabetic eye exam. Restart Ramipril 2.5 mg daily. Encouraged diet and exercise. Refill Pulmicort when necessary one year. Return in 6 months for physical exam. Refilled Januvia,  Lipitor, and generic Singulair. spent 25 minutes with patient going over multiple medical problems and listening to issues with stress

## 2012-06-25 NOTE — Patient Instructions (Addendum)
Continue Januvia. Have refilled Pulmicort and Singulair. Have refilled generic Lipitor. Return in 6 months for physical exam. Singulair refilled

## 2012-06-28 ENCOUNTER — Other Ambulatory Visit: Payer: Self-pay

## 2012-06-28 MED ORDER — BUDESONIDE 180 MCG/ACT IN AEPB: 2.0000 | INHALATION_SPRAY | Freq: Two times a day (BID) | RESPIRATORY_TRACT | Status: DC

## 2012-07-01 ENCOUNTER — Encounter: Payer: Self-pay | Admitting: Internal Medicine

## 2012-07-01 DIAGNOSIS — E8881 Metabolic syndrome: Secondary | ICD-10-CM | POA: Insufficient documentation

## 2012-07-01 NOTE — Progress Notes (Signed)
Subjective:    Patient ID: Carla [name], female    DOB: 08-Nov-1955, 57 y.o.   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 History of diabetes mellitus type 2 controlled with medication. History of right hip replacement October 2013. Difficulty exercising because of musculoskeletal pain. History of allergic rhinitis treated with Singulair and Pulmicort. History of hyperlipidemia treated with Lipitor. Had Pneumovax immunization September 2010. Tetanus immunization February 2012. History of vitamin D deficiency.  History of HSV type II and condyloma acuminata. History of fractured right wrist 1990. History of partial phalangectomy right second toe 1990. Endometrial polypectomy along with D&C procedure 1998. Arthroscopic debridement of left hip in the spring of 1999 at [facility] pedis [facility]. Left hip replacement November 1999. Total abdominal hysterectomy, BSO for fibroids and right dermoid cyst in 2007. Right hip replacement 2010. Colonoscopy 2006. Bone density study 2010. Dr. Jennette Kettle is GYN care. Gets mammograms through his office.  Discussed with her in the past taking Altace 2.5 mg daily for renal protection with diabetes but she has not wanted to do that. Social history: Nonsmoker. Social alcohol consumption. Husband is also a diabetic and has poor health with liver failure. No children.  Family history: Father died of complications of Parkinson's disease. Mother in fairly good health. One brother in good health. Half sister in good health.   Reminded about diabetic eye exam on a yearly basis.  Currently does not have health insurance. Hoping to get something with Affordable Care Act.    Review of Systems  Constitutional: Positive for fatigue.  HENT: Negative.   Eyes: Negative.   Respiratory: Negative.   Cardiovascular: Negative.   Gastrointestinal: Negative.   Endocrine: Negative.   Genitourinary:  Negative.   Allergic/Immunologic: Positive for environmental allergies.  Neurological: Negative.   Hematological: Negative.   Psychiatric/Behavioral: Negative.        Objective:   Physical Exam  Vitals reviewed. Constitutional: She is oriented to person, place, and time. She appears well-developed and well-nourished. No distress.  HENT:  Head: Normocephalic and atraumatic.  Right Ear: External ear normal.  Left Ear: External ear normal.  Mouth/Throat: Oropharyngeal exudate present.  Eyes: Conjunctivae and EOM are normal. Pupils are equal, round, and reactive to light. Right eye exhibits no discharge. Left eye exhibits no discharge. No scleral icterus.  No diabetic retinopathy  Neck: Neck supple. No JVD present. No thyromegaly present.  Cardiovascular: Normal rate, regular rhythm, normal heart sounds and intact distal pulses.   No murmur heard. Pulmonary/Chest: Effort normal and breath sounds normal. No respiratory distress. She has no wheezes. She has no rales.  Breasts normal female  Abdominal: Soft. Bowel sounds are normal. She exhibits no distension and no mass. There is no tenderness. There is no guarding.  Genitourinary:  Per Dr. Jennette Kettle  Musculoskeletal: Normal range of motion. She exhibits no edema.  Diabetic foot exam no ulcers or calluses. Pulses are normal in feet  Lymphadenopathy:    She has no cervical adenopathy.  Neurological: She is alert and oriented to person, place, and time. She has normal reflexes. She displays normal reflexes. No cranial nerve deficit. Coordination normal.  Skin: Skin is warm and dry. No rash noted. She is not diaphoretic.  Psychiatric: She has a normal mood and affect. Her behavior is normal. Judgment and thought content normal.          Assessment & Plan:  Type 2 diabetes mellitus control with medication. Want  paient to take Altace 2.5 mg daily for renal protection  Obesity-not able to exercise due to hip pain  Hyperlipidemia-treated  with Lipitor  Metabolic syndrome-encouraged diet and exercise  Allergic rhinitis-stable with Pulmicort and Singulair  History of hip replacement with repeat right hip replacement planned October 2013  Plan: Return in 6 months for hemoglobin A1c and office visit.

## 2012-07-05 DIAGNOSIS — E119 Type 2 diabetes mellitus without complications: Secondary | ICD-10-CM

## 2012-07-19 ENCOUNTER — Other Ambulatory Visit: Payer: Self-pay

## 2012-07-19 ENCOUNTER — Telehealth: Payer: Self-pay

## 2012-07-19 DIAGNOSIS — E119 Type 2 diabetes mellitus without complications: Secondary | ICD-10-CM

## 2012-07-19 MED ORDER — GLIPIZIDE 10 MG PO TABS: 10.0000 mg | ORAL_TABLET | Freq: Two times a day (BID) | ORAL | Status: DC

## 2012-07-19 NOTE — Telephone Encounter (Signed)
Since patient's Pulmicort was denied per her Insurance co. Dr. Lenord Fellers opted to not try for prior approval of Januvia. Will rx Glucatrol plain 10mg  bid ac. Sent to [employer] . Patient aware, and she was instructed too monitor bs readings over next 6 weeks

## 2012-08-31 IMAGING — CR DG HIP 1V PORT*R*
1 series · 1 of 1 positions shown · non-contrast
Comparison: 02/08/2011

CLINICAL DATA: Postop right hip

PORTABLE RIGHT HIP - 1 VIEW

[AP]
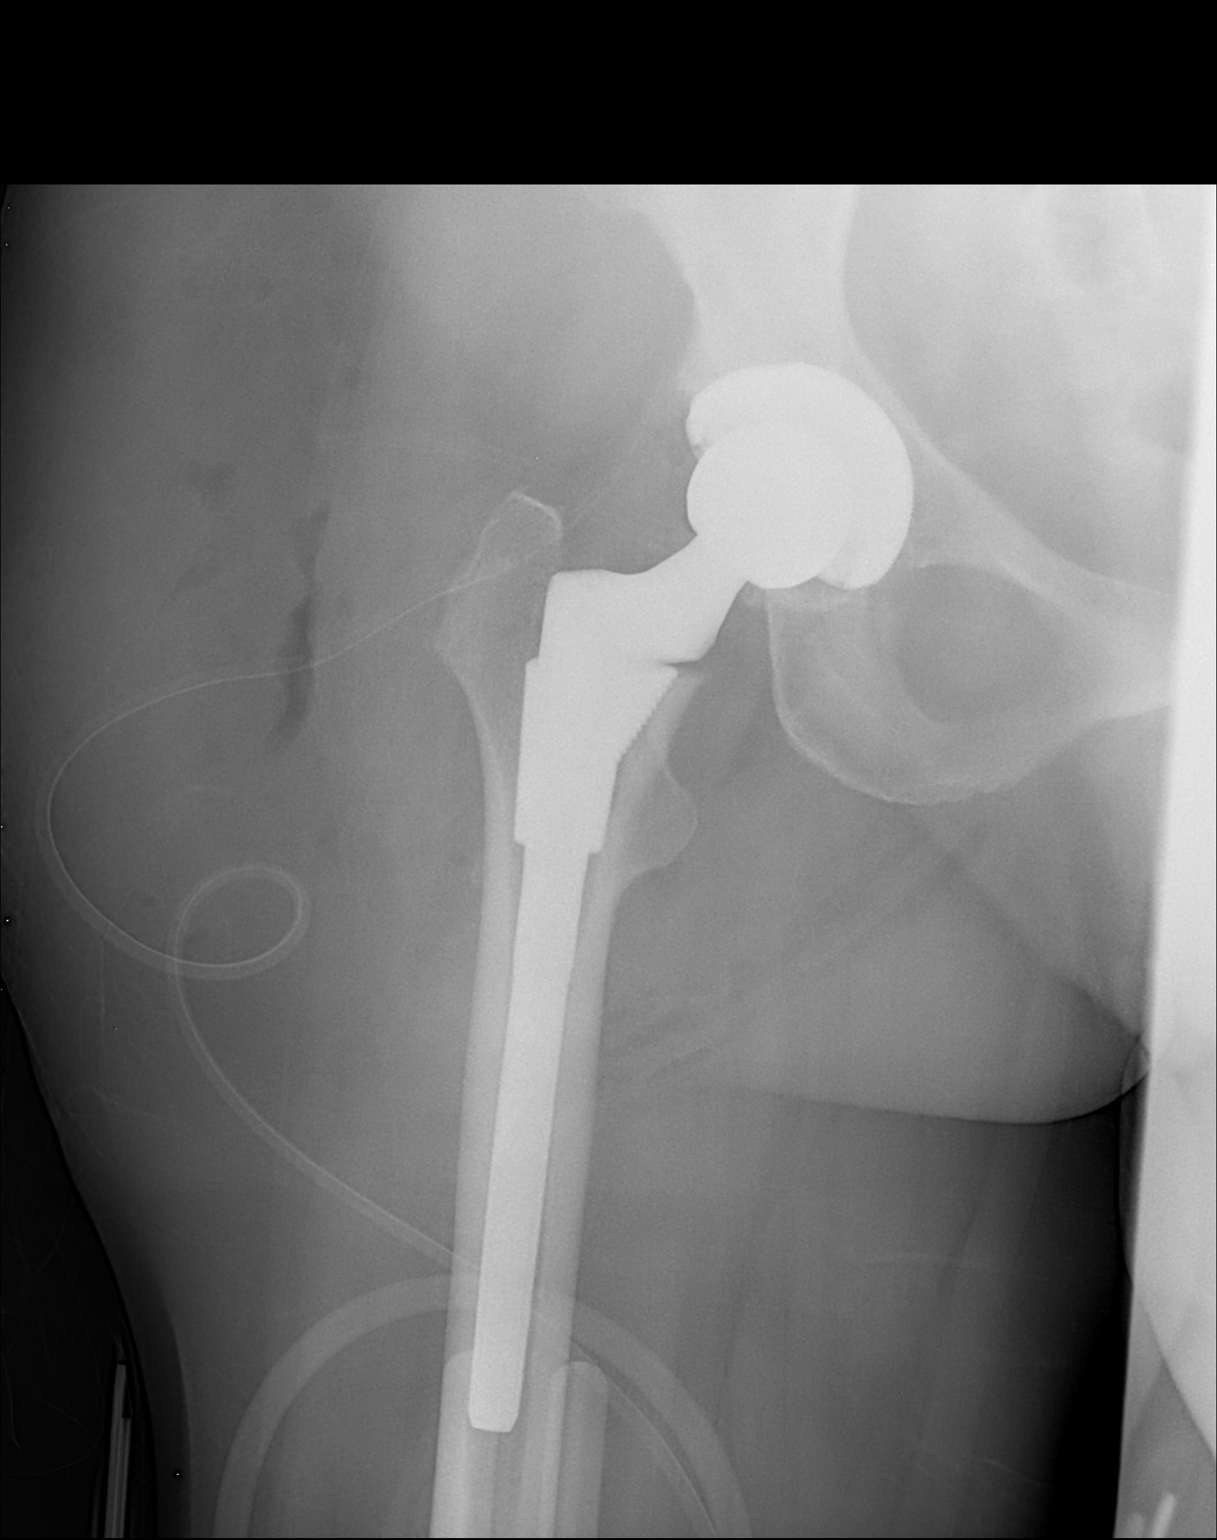

[1 of 1 positions shown; findings below may reference images not displayed]

FINDINGS: Satisfactory postoperative appearance status post right
total hip arthroplasty.  No evidence of hardware complication.

No fracture or dislocation is seen.

Associated subcutaneous gas and a surgical drain.
IMPRESSION: Satisfactory postoperative appearance status post right total hip
arthroplasty.

## 2013-03-20 ENCOUNTER — Ambulatory Visit (INDEPENDENT_AMBULATORY_CARE_PROVIDER_SITE_OTHER): Payer: BC Managed Care – PPO | Admitting: Internal Medicine

## 2013-03-20 DIAGNOSIS — Z23 Encounter for immunization: Secondary | ICD-10-CM

## 2013-04-15 ENCOUNTER — Ambulatory Visit (INDEPENDENT_AMBULATORY_CARE_PROVIDER_SITE_OTHER): Payer: BC Managed Care – PPO | Admitting: Internal Medicine

## 2013-04-15 ENCOUNTER — Encounter: Payer: Self-pay | Admitting: Internal Medicine

## 2013-04-15 VITALS — BP 126/84 | HR 68 | Temp 98.1°F | Ht 62.0 in | Wt 162.0 lb

## 2013-04-15 DIAGNOSIS — H6503 Acute serous otitis media, bilateral: Secondary | ICD-10-CM

## 2013-04-15 DIAGNOSIS — H6500 Acute serous otitis media, unspecified ear: Secondary | ICD-10-CM

## 2013-04-15 DIAGNOSIS — J069 Acute upper respiratory infection, unspecified: Secondary | ICD-10-CM

## 2013-04-15 MED ORDER — BENZONATATE 200 MG PO CAPS: 200.0000 mg | ORAL_CAPSULE | Freq: Three times a day (TID) | ORAL | Status: DC | PRN

## 2013-04-15 MED ORDER — HYDROCODONE-HOMATROPINE 5-1.5 MG/5ML PO SYRP: 5.0000 mL | ORAL_SOLUTION | Freq: Three times a day (TID) | ORAL | Status: DC | PRN

## 2013-04-15 MED ORDER — AZITHROMYCIN 250 MG PO TABS: ORAL_TABLET | ORAL | Status: DC

## 2013-04-15 NOTE — Patient Instructions (Signed)
Take Z-pak as directed. If not better in one week, have Z-pak refilled. Take cough preparations as prescriptions.

## 2013-04-15 NOTE — Progress Notes (Signed)
   Subjective:    Patient ID: Carla [name], female    DOB: 1955-11-25, 57 y.o.   MRN: [id number]  HPI Onset December 5 of URI symptoms and malaise. Has bilateral ear pain. No significant sore throat. Has developed cough that is dry. Last night felt very chilled but no shaking chills and no documented fever.    Review of Systems     Objective:   Physical Exam Pulse oximetry 96% on room air. Skin is warm and dry. TMs are full bilaterally but not red. Pharynx only very slightly injected. Neck is supple without significant adenopathy. Chest clear      Assessment & Plan:  Acute URI  Acute bilateral serous otitis media  Plan: Zithromax Z-PAK 2 tabs day one followed by 1 tablet days 2 through 5 with 1 refill. If not better in one week have prescription refilled. Tessalon Perles 200 mg (#60) 1 by mouth 3 times a day when necessary cough. Hycodan 8 ounces 1 teaspoon by mouth every 6 hours when necessary cough.

## 2013-05-13 ENCOUNTER — Ambulatory Visit: Payer: BC Managed Care – PPO | Admitting: Podiatry

## 2013-06-03 ENCOUNTER — Ambulatory Visit: Payer: BC Managed Care – PPO | Admitting: Podiatry

## 2013-06-03 ENCOUNTER — Other Ambulatory Visit: Payer: BC Managed Care – PPO | Admitting: Internal Medicine

## 2013-06-06 ENCOUNTER — Encounter: Payer: BC Managed Care – PPO | Admitting: Internal Medicine

## 2013-08-05 ENCOUNTER — Other Ambulatory Visit: Payer: BC Managed Care – PPO | Admitting: Internal Medicine

## 2013-08-05 DIAGNOSIS — E119 Type 2 diabetes mellitus without complications: Secondary | ICD-10-CM

## 2013-08-05 DIAGNOSIS — Z79899 Other long term (current) drug therapy: Secondary | ICD-10-CM

## 2013-08-05 DIAGNOSIS — Z1329 Encounter for screening for other suspected endocrine disorder: Secondary | ICD-10-CM

## 2013-08-05 DIAGNOSIS — E559 Vitamin D deficiency, unspecified: Secondary | ICD-10-CM

## 2013-08-05 DIAGNOSIS — Z130 Encounter for screening for diseases of the blood and blood-forming organs and certain disorders involving the immune mechanism: Secondary | ICD-10-CM

## 2013-08-05 DIAGNOSIS — E785 Hyperlipidemia, unspecified: Secondary | ICD-10-CM

## 2013-08-05 LAB — LIPID PANEL
Cholesterol: 266 mg/dL — ABNORMAL HIGH (ref 0–200)
HDL: 62 mg/dL (ref >39)
LDL Cholesterol: 141 mg/dL — ABNORMAL HIGH (ref 0–99)
Total CHOL/HDL Ratio: 4.3 Ratio
Triglycerides: 316 mg/dL — ABNORMAL HIGH (ref <150)
VLDL: 63 mg/dL — AB (ref 0–40)

## 2013-08-05 LAB — CBC WITH DIFFERENTIAL/PLATELET
Basophils Absolute: 0.1 10*3/uL (ref 0.0–0.1)
Basophils Relative: 1 % (ref 0–1)
EOS PCT: 3 % (ref 0–5)
Eosinophils Absolute: 0.2 10*3/uL (ref 0.0–0.7)
HEMATOCRIT: 38.8 % (ref 36.0–46.0)
HEMOGLOBIN: 12.9 g/dL (ref 12.0–15.0)
LYMPHS ABS: 2.7 10*3/uL (ref 0.7–4.0)
Lymphocytes Relative: 40 % (ref 12–46)
MCH: 29.5 pg (ref 26.0–34.0)
MCHC: 33.2 g/dL (ref 30.0–36.0)
MCV: 88.6 fL (ref 78.0–100.0)
Monocytes Absolute: 0.6 10*3/uL (ref 0.1–1.0)
Monocytes Relative: 9 % (ref 3–12)
NEUTROS PCT: 47 % (ref 43–77)
Neutro Abs: 3.2 10*3/uL (ref 1.7–7.7)
Platelets: 307 10*3/uL (ref 150–400)
RBC: 4.38 MIL/uL (ref 3.87–5.11)
RDW: 14.4 % (ref 11.5–15.5)
WBC: 6.8 10*3/uL (ref 4.0–10.5)

## 2013-08-05 LAB — COMPREHENSIVE METABOLIC PANEL
ALT: 12 U/L (ref 0–35)
AST: 17 U/L (ref 0–37)
Albumin: 4.1 g/dL (ref 3.5–5.2)
Alkaline Phosphatase: 90 U/L (ref 39–117)
BUN: 21 mg/dL (ref 6–23)
CO2: 30 meq/L (ref 19–32)
Calcium: 9.4 mg/dL (ref 8.4–10.5)
Chloride: 102 mEq/L (ref 96–112)
Creat: 0.78 mg/dL (ref 0.50–1.10)
GLUCOSE: 97 mg/dL (ref 70–99)
Potassium: 4.8 mEq/L (ref 3.5–5.3)
SODIUM: 140 meq/L (ref 135–145)
TOTAL PROTEIN: 7.1 g/dL (ref 6.0–8.3)
Total Bilirubin: 0.3 mg/dL (ref 0.2–1.2)

## 2013-08-05 LAB — HEMOGLOBIN A1C
HEMOGLOBIN A1C: 6 % — AB (ref <5.7)
MEAN PLASMA GLUCOSE: 126 mg/dL — AB (ref <117)

## 2013-08-05 LAB — TSH: TSH: 2.488 u[IU]/mL (ref 0.350–4.500)

## 2013-08-06 ENCOUNTER — Ambulatory Visit (INDEPENDENT_AMBULATORY_CARE_PROVIDER_SITE_OTHER): Payer: BC Managed Care – PPO | Admitting: Internal Medicine

## 2013-08-06 ENCOUNTER — Encounter: Payer: Self-pay | Admitting: Internal Medicine

## 2013-08-06 VITALS — BP 148/96 | HR 80 | Temp 98.8°F | Ht 62.0 in | Wt 174.0 lb

## 2013-08-06 DIAGNOSIS — E119 Type 2 diabetes mellitus without complications: Secondary | ICD-10-CM

## 2013-08-06 DIAGNOSIS — Z0000 Encounter for general adult medical examination without abnormal findings: Secondary | ICD-10-CM

## 2013-08-06 DIAGNOSIS — E785 Hyperlipidemia, unspecified: Secondary | ICD-10-CM

## 2013-08-06 DIAGNOSIS — Z8639 Personal history of other endocrine, nutritional and metabolic disease: Secondary | ICD-10-CM

## 2013-08-06 DIAGNOSIS — I10 Essential (primary) hypertension: Secondary | ICD-10-CM

## 2013-08-06 DIAGNOSIS — E8881 Metabolic syndrome: Secondary | ICD-10-CM

## 2013-08-06 DIAGNOSIS — Z96649 Presence of unspecified artificial hip joint: Secondary | ICD-10-CM

## 2013-08-06 DIAGNOSIS — E669 Obesity, unspecified: Secondary | ICD-10-CM

## 2013-08-06 DIAGNOSIS — Z96643 Presence of artificial hip joint, bilateral: Secondary | ICD-10-CM

## 2013-08-06 DIAGNOSIS — J309 Allergic rhinitis, unspecified: Secondary | ICD-10-CM

## 2013-08-06 LAB — POCT URINALYSIS DIPSTICK
Bilirubin, UA: NEGATIVE
Blood, UA: NEGATIVE
GLUCOSE UA: NEGATIVE
Ketones, UA: NEGATIVE
Leukocytes, UA: NEGATIVE
NITRITE UA: NEGATIVE
Protein, UA: NEGATIVE
SPEC GRAV UA: 1.01
Urobilinogen, UA: NEGATIVE
pH, UA: 5.5

## 2013-08-06 LAB — VITAMIN D 25 HYDROXY (VIT D DEFICIENCY, FRACTURES): Vit D, 25-Hydroxy: 35 ng/mL (ref 30–89)

## 2013-08-06 MED ORDER — MONTELUKAST SODIUM 10 MG PO TABS
10.0000 mg | ORAL_TABLET | Freq: Every day | ORAL | Status: DC
Start: 2013-08-06 — End: 2013-09-25

## 2013-08-06 MED ORDER — GLIPIZIDE 10 MG PO TABS: 10.0000 mg | ORAL_TABLET | Freq: Two times a day (BID) | ORAL | Status: DC

## 2013-08-06 MED ORDER — RAMIPRIL 5 MG PO CAPS: 5.0000 mg | ORAL_CAPSULE | Freq: Every day | ORAL | Status: DC

## 2013-08-06 MED ORDER — ATORVASTATIN CALCIUM 20 MG PO TABS: 20.0000 mg | ORAL_TABLET | Freq: Every day | ORAL | Status: DC

## 2013-08-06 NOTE — Progress Notes (Signed)
Subjective:    Patient ID: Carla [name], female    DOB: 26-Jul-1955, 58 y.o.   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 She has run out of several medications. Apparently there was a glitch in her insurance coverage earlier this year. She is out of Singulair, Ramapo real and glipizide. She's been out of Lipitor as well.  She had diabetic eye exam by Dr. Phineas Douglas at [facility] in December 2014  Blood pressure is elevated today at 146/96. She's been on low-dose Ramapo real for some time and blood pressures been stable. Ramapo real was just started because of history of diabetes.  Never had shingles vaccine and prescription was given for her to have this through pharmacy. She will check on the cost with her insurance company.  She has a history of allergic rhinitis treated with Singulair and Pulmicort.  Had Pneumovax immunization September 2010. Tetanus immunization February 2012.  Has GYN physician that she'll be seeing in the near future, Dr. Nori Riis who will also due for mammogram. History of HSV type II and condyloma acuminata.  History of fractured right wrist 1990. History of partial phalangectomy right second toe 1990. Endometrial polypectomy along with D&C procedure 1998. Arthroscopic debridement of left hip in the spring of 1999 at [facility]. Left hip replacement November 1999. Total abdominal hysterectomy, BSO for fibroids and right dermoid cyst in 2007. Right hip replacement 2010. Had colonoscopy in 2006. Bone density study 2010.  Social history: This is her second marriage. Current husband has chronic hepatitis and diabetes and is unable to work. They have some financial issues. No children.  Family history: Father died of complications of Parkinson's  disease. Mother in fairly good health. One brother in good health. Has sister in good health.  Has been working out with a trainer a couple of days a week. Says she has lost some weight recently but my records she's actually gained 8 pounds since last year    Review of Systems  Constitutional: Positive for fatigue.  HENT: Negative.   Eyes:       Wears glasses  Respiratory: Negative.   Cardiovascular: Negative.   Gastrointestinal: Negative.   Endocrine:       History of type 2 diabetes mellitus  Genitourinary: Negative.   Allergic/Immunologic: Positive for environmental allergies.  Neurological: Negative.   Hematological: Negative.   Psychiatric/Behavioral: Negative.        Objective:   Physical Exam  Vitals reviewed. Constitutional: She is oriented to person, place, and time. She appears well-developed and well-nourished. No distress.  HENT:  Head: Normocephalic and atraumatic.  Right Ear: External ear normal.  Left Ear: External ear normal.  Mouth/Throat: Oropharynx is clear and moist. No oropharyngeal exudate.  Eyes: Conjunctivae and EOM are normal. Pupils are equal, round, and reactive to light. Right eye exhibits no discharge. Left eye exhibits no discharge. No scleral icterus.  Neck: Neck supple. No JVD present. No thyromegaly present.  Cardiovascular: Normal rate, regular rhythm, normal heart sounds and intact distal pulses.   No murmur heard. Pulmonary/Chest: Effort normal and breath sounds normal. No respiratory distress. She has no wheezes. She has no rales. She exhibits no tenderness.  Abdominal: Soft. Bowel sounds are normal. She exhibits no distension. There is no tenderness. There is no guarding.  Genitourinary:  Deferred to GYN  but is status post [place] hysterectomy BSO  Musculoskeletal: Normal range of motion. She exhibits no edema and no tenderness.  Lymphadenopathy:    She has no cervical adenopathy.  Neurological: She is alert and oriented to person,  place, and time. No cranial nerve deficit. Coordination normal.  Skin: Skin is warm and dry. No rash noted. She is not diaphoretic.  Psychiatric: She has a normal mood and affect. Her behavior is normal. Judgment and thought content normal.          Assessment & Plan:  Controlled type 2 diabetes mellitus-hemoglobin A1c is stable  Hypertension blood pressure elevated today on low-dose Ramipril. Increase Ramipril to 5 mg daily  Hyperlipidemia has been out of statin medication and lipid panel reflects that  History of allergic rhinitis-Singulair refilled  Obesity-as gained 8 pounds since last year. Patient says she's been working out with a trainer and watching her diet.  History of vitamin D deficiency-vitamin D level is within normal limits  History of osteoarthritis and status post bilateral hip replacements  Plan: Suggest patient return in 3 months for lipid panel, liver functions and blood pressure check. Medications have been reviewed. Hopefully she will not run out of meds in the future.

## 2013-08-06 NOTE — Patient Instructions (Signed)
Increase antihypertensive medication to 5 mg daily. Restart Lipitor and Singulair. Watch diet and lose weight. Return in 3 months for office visit blood pressure check lipid panel liver functions

## 2013-09-25 ENCOUNTER — Other Ambulatory Visit: Payer: Self-pay | Admitting: Internal Medicine

## 2013-10-01 ENCOUNTER — Encounter: Payer: Self-pay | Admitting: Internal Medicine

## 2013-10-01 ENCOUNTER — Ambulatory Visit (INDEPENDENT_AMBULATORY_CARE_PROVIDER_SITE_OTHER): Payer: BC Managed Care – PPO | Admitting: Internal Medicine

## 2013-10-01 VITALS — BP 136/80 | HR 80 | Temp 99.4°F | Wt 171.0 lb

## 2013-10-01 DIAGNOSIS — H6590 Unspecified nonsuppurative otitis media, unspecified ear: Secondary | ICD-10-CM

## 2013-10-01 DIAGNOSIS — H6593 Unspecified nonsuppurative otitis media, bilateral: Secondary | ICD-10-CM

## 2013-10-01 DIAGNOSIS — J309 Allergic rhinitis, unspecified: Secondary | ICD-10-CM

## 2013-10-01 DIAGNOSIS — J069 Acute upper respiratory infection, unspecified: Secondary | ICD-10-CM

## 2013-10-01 MED ORDER — HYDROCODONE-HOMATROPINE 5-1.5 MG/5ML PO SYRP: 5.0000 mL | ORAL_SOLUTION | Freq: Three times a day (TID) | ORAL | Status: DC | PRN

## 2013-10-01 MED ORDER — AZITHROMYCIN 250 MG PO TABS: ORAL_TABLET | ORAL | Status: DC

## 2013-10-01 MED ORDER — METHYLPREDNISOLONE ACETATE 80 MG/ML IJ SUSP
80.0000 mg | Freq: Once | INTRAMUSCULAR | Status: AC
  Administered 2013-10-01: 80 mg via INTRAMUSCULAR

## 2013-10-01 NOTE — Progress Notes (Signed)
   Subjective:    Patient ID: Carla [name], female    DOB: March 03, 1956, 58 y.o.   MRN: [id number]  HPI Onset May 21st of URI symptoms. Has had cough, congestion and wheezing. History of allergic rhinitis which is long-standing. Has had some slight sore throat, malaise and fatigue. Low-grade fever. She is a massage therapist.    Review of Systems     Objective:   Physical Exam TMs are full bilaterally but not red. Pharynx slightly injected. Neck is supple without adenopathy. Chest clear to auscultation without rales or wheezing.       Assessment & Plan:  Bilateral serous otitis media  Acute URI  History of allergic rhinitis  Bronchospasm  Plan: Depo-Medrol 80 mg IM. Zithromax Z-PAK take hysterectomy with one refill. If not better in one week have prescription refill. Hycodan 8 ounces 1 teaspoon by mouth Q6 to 8 hours when necessary cough. Use albuterol inhaler 2 sprays by mouth 4 times daily.

## 2013-10-01 NOTE — Addendum Note (Signed)
Addended by: Brett Canales on: 10/01/2013 05:07 PM   Modules accepted: Orders

## 2013-10-01 NOTE — Patient Instructions (Addendum)
Take Z-Pak as directed. If Not better in one week, have prescription refilled. Depo-Medrol 80 mg IM given today. Hycodan 8 ounces 1 teaspoon by mouth each bedtime when necessary cough. Use inhaler for wheezing.

## 2013-10-03 ENCOUNTER — Other Ambulatory Visit: Payer: Self-pay | Admitting: Obstetrics & Gynecology

## 2013-10-03 DIAGNOSIS — R928 Other abnormal and inconclusive findings on diagnostic imaging of breast: Secondary | ICD-10-CM

## 2013-10-14 ENCOUNTER — Ambulatory Visit
Admission: RE | Admit: 2013-10-14 | Discharge: 2013-10-14 | Disposition: A | Discharge status: Home / Self Care | Payer: BC Managed Care – PPO | Source: Ambulatory Visit | Attending: Obstetrics & Gynecology | Admitting: Obstetrics & Gynecology

## 2013-10-14 ENCOUNTER — Other Ambulatory Visit: Payer: Self-pay | Admitting: Obstetrics & Gynecology

## 2013-10-14 ENCOUNTER — Encounter (INDEPENDENT_AMBULATORY_CARE_PROVIDER_SITE_OTHER): Payer: Self-pay

## 2013-10-14 DIAGNOSIS — R928 Other abnormal and inconclusive findings on diagnostic imaging of breast: Secondary | ICD-10-CM

## 2013-10-14 LAB — HM MAMMOGRAPHY

## 2013-10-31 ENCOUNTER — Other Ambulatory Visit: Payer: Self-pay | Admitting: Internal Medicine

## 2013-10-31 ENCOUNTER — Other Ambulatory Visit: Payer: BC Managed Care – PPO | Admitting: Internal Medicine

## 2013-10-31 DIAGNOSIS — E785 Hyperlipidemia, unspecified: Secondary | ICD-10-CM

## 2013-10-31 DIAGNOSIS — Z79899 Other long term (current) drug therapy: Secondary | ICD-10-CM

## 2013-10-31 LAB — LIPID PANEL
CHOL/HDL RATIO: 2.6 ratio
CHOLESTEROL: 210 mg/dL — AB (ref 0–200)
HDL: 82 mg/dL (ref >39)
LDL Cholesterol: 109 mg/dL — ABNORMAL HIGH (ref 0–99)
TRIGLYCERIDES: 97 mg/dL (ref <150)
VLDL: 19 mg/dL (ref 0–40)

## 2013-10-31 LAB — HEPATIC FUNCTION PANEL
ALBUMIN: 4.2 g/dL (ref 3.5–5.2)
ALT: 15 U/L (ref 0–35)
AST: 17 U/L (ref 0–37)
Alkaline Phosphatase: 81 U/L (ref 39–117)
BILIRUBIN INDIRECT: 0.6 mg/dL (ref 0.2–1.2)
Bilirubin, Direct: 0.1 mg/dL (ref 0.0–0.3)
Total Bilirubin: 0.7 mg/dL (ref 0.2–1.2)
Total Protein: 7 g/dL (ref 6.0–8.3)

## 2013-11-01 ENCOUNTER — Ambulatory Visit (INDEPENDENT_AMBULATORY_CARE_PROVIDER_SITE_OTHER): Payer: BC Managed Care – PPO | Admitting: Internal Medicine

## 2013-11-01 ENCOUNTER — Encounter: Payer: Self-pay | Admitting: Internal Medicine

## 2013-11-01 VITALS — BP 110/74 | HR 80 | Temp 99.0°F | Wt 168.0 lb

## 2013-11-01 DIAGNOSIS — I10 Essential (primary) hypertension: Secondary | ICD-10-CM

## 2013-11-01 DIAGNOSIS — E8881 Metabolic syndrome: Secondary | ICD-10-CM

## 2013-11-01 DIAGNOSIS — E669 Obesity, unspecified: Secondary | ICD-10-CM

## 2013-11-01 DIAGNOSIS — E119 Type 2 diabetes mellitus without complications: Secondary | ICD-10-CM

## 2013-11-01 DIAGNOSIS — E785 Hyperlipidemia, unspecified: Secondary | ICD-10-CM

## 2013-11-01 LAB — HEMOGLOBIN A1C
Hgb A1c MFr Bld: 6.4 % — ABNORMAL HIGH (ref <5.7)
MEAN PLASMA GLUCOSE: 137 mg/dL — AB (ref <117)

## 2013-11-02 NOTE — Patient Instructions (Signed)
Continue exercising. Must watch diet as hemoglobin A1c has increased. Return in 3 months. Need Accu-Chek machine and diabetic test strips. Continue generic Lipitor.

## 2013-11-02 NOTE — Progress Notes (Signed)
   Subjective:    Patient ID: Carla [name], female    DOB: 11/16/1955, 58 y.o.   MRN: [id number]  HPI  In today for followup on hyperlipidemia. In March when she was here for physical examination she had run out of statin medication. Lipids were elevated. She is now back on generic Lipitor and lipid  panel  improved considerably. She's been working out about 4 days a week with a Physiological scientist. Feels better. Really has not been watching her diet as much as she should. Wants hemoglobin A1c checked. Last visit was 6.0% which was good. She's lost 6 pounds since last visit. Blood pressure is excellent have increased Ramapril to 5 mg daily at last visit. It is 110/74.    Review of Systems     Objective:   Physical Exam Neck supple without thyromegaly JVD or carotid bruits. Chest clear. Cardiac exam regular rate and rhythm normal S1 and S2. Extremities without edema.       Assessment & Plan:  Controlled type 2 diabetes-hemoglobin A1c pending  Hyperlipidemia-markedly improved with restarting Lipitor  Obesity-working out 4 days a week. Has lost 6 pounds. Continue diet and exercise  Hypertension-improved   Addendum: Hemoglobin A1c has increased to 6.4%. Admits she is not watching her diet as much as she should. Needs to watch diet and return here in 3 months for office visit in hemoglobin A 1C. Mailed prescription for home glucose monitor diabetic test strips and lancets.

## 2013-11-04 NOTE — Progress Notes (Signed)
Patient informed. 

## 2014-01-27 ENCOUNTER — Ambulatory Visit (INDEPENDENT_AMBULATORY_CARE_PROVIDER_SITE_OTHER): Payer: BC Managed Care – PPO | Admitting: Internal Medicine

## 2014-01-27 ENCOUNTER — Encounter: Payer: Self-pay | Admitting: Internal Medicine

## 2014-01-27 VITALS — BP 118/72 | HR 80 | Ht 62.0 in | Wt 168.0 lb

## 2014-01-27 DIAGNOSIS — J069 Acute upper respiratory infection, unspecified: Secondary | ICD-10-CM

## 2014-01-27 DIAGNOSIS — J9801 Acute bronchospasm: Secondary | ICD-10-CM

## 2014-01-27 DIAGNOSIS — E119 Type 2 diabetes mellitus without complications: Secondary | ICD-10-CM

## 2014-01-27 MED ORDER — BENZONATATE 200 MG PO CAPS: 200.0000 mg | ORAL_CAPSULE | Freq: Three times a day (TID) | ORAL | Status: DC | PRN

## 2014-01-27 MED ORDER — HYDROCODONE-HOMATROPINE 5-1.5 MG/5ML PO SYRP: 5.0000 mL | ORAL_SOLUTION | Freq: Three times a day (TID) | ORAL | Status: DC | PRN

## 2014-01-27 MED ORDER — AZITHROMYCIN 250 MG PO TABS: ORAL_TABLET | ORAL | Status: DC

## 2014-01-27 NOTE — Patient Instructions (Addendum)
Take Z-pak as directed. Take tessalon perles during the day for cough. Take Hycodan sparingly for cough. Use inhaler 4 times daily. Hold off prescribing prednisone.

## 2014-03-27 ENCOUNTER — Ambulatory Visit (INDEPENDENT_AMBULATORY_CARE_PROVIDER_SITE_OTHER): Payer: BC Managed Care – PPO | Admitting: Internal Medicine

## 2014-03-27 DIAGNOSIS — Z23 Encounter for immunization: Secondary | ICD-10-CM

## 2014-03-30 ENCOUNTER — Encounter: Payer: Self-pay | Admitting: Internal Medicine

## 2014-03-30 NOTE — Progress Notes (Signed)
   Subjective:    Patient ID: Carla [name], female    DOB: 16-Jul-1955, 58 y.o.   MRN: [id number]  HPI In today with symptoms. Has malaise and fatigue. Has been or congestion. No documented fever. Congestion is mostly in her he had but seems to be going towards her chest. Some slight cough. Has had some audible wheezing.    Review of Systems     Objective:   Physical Exam  TMs are slightly full bilaterally. Pharynx slightly injected. Neck is supple without adenopathy. Chest mainly clear to auscultation except for some scattered inspiratory wheezing that is minimal      Assessment & Plan:  Acute URI  Bronchospasm  Plan: Albuterol inhaler 2 sprays by mouth 4 times a day as needed. Hycodan 1 teaspoon by mouth every 8 hours when necessary cough at night. Tessalon Perles 200 mg 3 times daily as needed for cough during the day at work. Zithromax Z-PAK take as directed.

## 2014-09-08 ENCOUNTER — Ambulatory Visit (INDEPENDENT_AMBULATORY_CARE_PROVIDER_SITE_OTHER): Payer: 59 | Admitting: Internal Medicine

## 2014-09-08 ENCOUNTER — Encounter: Payer: Self-pay | Admitting: Internal Medicine

## 2014-09-08 VITALS — BP 118/82 | HR 68 | Temp 98.3°F | Wt 179.0 lb

## 2014-09-08 DIAGNOSIS — R030 Elevated blood-pressure reading, without diagnosis of hypertension: Secondary | ICD-10-CM | POA: Diagnosis not present

## 2014-09-08 DIAGNOSIS — R55 Syncope and collapse: Secondary | ICD-10-CM | POA: Diagnosis not present

## 2014-09-08 DIAGNOSIS — E119 Type 2 diabetes mellitus without complications: Secondary | ICD-10-CM | POA: Diagnosis not present

## 2014-09-08 DIAGNOSIS — I10 Essential (primary) hypertension: Secondary | ICD-10-CM | POA: Diagnosis not present

## 2014-09-08 DIAGNOSIS — H6503 Acute serous otitis media, bilateral: Secondary | ICD-10-CM

## 2014-09-08 DIAGNOSIS — R42 Dizziness and giddiness: Secondary | ICD-10-CM | POA: Diagnosis not present

## 2014-09-08 DIAGNOSIS — IMO0001 Reserved for inherently not codable concepts without codable children: Secondary | ICD-10-CM

## 2014-09-08 LAB — COMPLETE METABOLIC PANEL WITH GFR
ALBUMIN: 4.1 g/dL (ref 3.5–5.2)
ALT: 22 U/L (ref 0–35)
AST: 23 U/L (ref 0–37)
Alkaline Phosphatase: 110 U/L (ref 39–117)
BILIRUBIN TOTAL: 0.5 mg/dL (ref 0.2–1.2)
BUN: 16 mg/dL (ref 6–23)
CO2: 32 meq/L (ref 19–32)
Calcium: 9.7 mg/dL (ref 8.4–10.5)
Chloride: 102 mEq/L (ref 96–112)
Creat: 0.76 mg/dL (ref 0.50–1.10)
GFR, EST NON AFRICAN AMERICAN: 85 mL/min
GFR, Est African American: >89 mL/min
GLUCOSE: 84 mg/dL (ref 70–99)
Potassium: 4.8 mEq/L (ref 3.5–5.3)
Sodium: 140 mEq/L (ref 135–145)
Total Protein: 7.2 g/dL (ref 6.0–8.3)

## 2014-09-08 LAB — TSH: TSH: 2.77 u[IU]/mL (ref 0.350–4.500)

## 2014-09-08 LAB — T4, FREE: Free T4: 1.02 ng/dL (ref 0.80–1.80)

## 2014-09-08 MED ORDER — ALPRAZOLAM 0.25 MG PO TABS: ORAL_TABLET | ORAL | Status: DC

## 2014-09-08 MED ORDER — METHYLPREDNISOLONE ACETATE 80 MG/ML IJ SUSP
80.0000 mg | Freq: Once | INTRAMUSCULAR | Status: AC
  Administered 2014-09-08: 80 mg via INTRAMUSCULAR

## 2014-09-08 NOTE — Progress Notes (Signed)
   Subjective:    Patient ID: Carla [name], female    DOB: [birth year], 59 y.o.   MRN: [id number]  HPI  Patient is now working 2 jobs and is under stress. Feels the need work 2 jobs because of financial obligations. 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 Was seen at  [facility] and had blood pressure was elevated at 155/98. No EKG or lab work was done. She was checked by medical personnel who advised increasing Ramapril to 5 mg twice daily and was told to follow-up with me. Yesterday she took it easy. Still feels occasional wave of dizziness. No nausea and vomiting.  Felt a little wave of dizziness prior to syncope but had no real warning. Did not, feel nauseated, break out in a sweat. Came to immediately and could remember everything that happened. No seizure activity. Doesn't really describe this dizziness as room spinning dizziness.    Review of Systems     Objective:   Physical Exam  Skin warm and dry. Nodes none. No nystagmus. Extraocular movements are full. No focal deficits on brief neurological exam. Chest clear. Cardiac exam regular rate and rhythm normal S1 and S2. Extremities without edema. TMs are full bilaterally.      Assessment & Plan:  Syncope-etiology unclear but has been having dizziness recently. EKG is within normal limits  Elevated blood pressure at urgent care on April 30. Blood pressure now is stable. She is on twice a day Ramapril  Bilateral serous otitis media-treated with Depo-Medrol 80 mg IM  Type 2 diabetes mellitus  History of hypertension  Obesity  Plan: Patient had lab work drawn today. She is not fasting. Check electrolytes and CBC.  Plan: Patient is to have nonfasting lab work. Return in 2 weeks. Take Xanax 0.25 mg at bedtime

## 2014-09-08 NOTE — Patient Instructions (Addendum)
Take Ramapril  twice daily. Take it easy. Return in 2 weeks. Take Xanax at bedtime. Depo-Medrol 80 mg IM given.

## 2014-09-09 LAB — CBC WITH DIFFERENTIAL/PLATELET
BASOS ABS: 0.1 10*3/uL (ref 0.0–0.1)
Basophils Relative: 1 % (ref 0–1)
EOS PCT: 3 % (ref 0–5)
Eosinophils Absolute: 0.3 10*3/uL (ref 0.0–0.7)
HCT: 39.9 % (ref 36.0–46.0)
Hemoglobin: 13.2 g/dL (ref 12.0–15.0)
LYMPHS ABS: 3.1 10*3/uL (ref 0.7–4.0)
Lymphocytes Relative: 35 % (ref 12–46)
MCH: 30.3 pg (ref 26.0–34.0)
MCHC: 33.1 g/dL (ref 30.0–36.0)
MCV: 91.5 fL (ref 78.0–100.0)
MONOS PCT: 10 % (ref 3–12)
MPV: 10.1 fL (ref 8.6–12.4)
Monocytes Absolute: 0.9 10*3/uL (ref 0.1–1.0)
NEUTROS PCT: 51 % (ref 43–77)
Neutro Abs: 4.5 10*3/uL (ref 1.7–7.7)
PLATELETS: 308 10*3/uL (ref 150–400)
RBC: 4.36 MIL/uL (ref 3.87–5.11)
RDW: 13.9 % (ref 11.5–15.5)
WBC: 8.8 10*3/uL (ref 4.0–10.5)

## 2014-09-09 LAB — HEMOGLOBIN A1C
HEMOGLOBIN A1C: 6.5 % — AB (ref <5.7)
Mean Plasma Glucose: 140 mg/dL — ABNORMAL HIGH (ref <117)

## 2014-09-10 ENCOUNTER — Telehealth: Payer: Self-pay | Admitting: Internal Medicine

## 2014-09-10 DIAGNOSIS — H8110 Benign paroxysmal vertigo, unspecified ear: Secondary | ICD-10-CM

## 2014-09-10 DIAGNOSIS — R55 Syncope and collapse: Secondary | ICD-10-CM

## 2014-09-10 NOTE — Telephone Encounter (Signed)
Called patient to advised that Dr. Renold Genta wants her to increase Xanax to twice a day, do accuchecks.  Go on clear liquids until diarrhea resolves.  Advised patient that we are ordering and MRI for her and also making an appointment with a Neurologist for her as well ASAP.  As soon as we have date/times for her, we'll call her back with those.  Patient is upset that she has to be out of work all week because she can't stand and work like this.  Reassured patient that we are working to make these appointments ASAP so that we can get to the bottom of this issue and get answers for her so that we get her back to work ASAP.  Patient seemed to calm down a little.  Advised I'll call her back as soon as I have appointments for her.

## 2014-09-10 NOTE — Telephone Encounter (Signed)
Order MRI of brain with contrast. Appt with neurologist. Increase Xanax to twice daily. Check accuchecks.

## 2014-09-10 NOTE — Telephone Encounter (Signed)
Faxed referral/notes/info to [facility]; Attn Diane @ [phone].  Also put referral into [facility]:  Referral # [id number].  Waiting on call back from [facility] with appointment date/time.  Requested first available with any provider.

## 2014-09-10 NOTE — Telephone Encounter (Signed)
States she is still having dizzy spells and she normally has diarrhea, but what she's having today is worse.  States she is feeling worse today.  States she cannot work like this.  Doesn't think she has a fever.  Feels the dizziness is on the left side of her head.  Duration only seems to be a minute or two.  Feels that if she's standing up and has the dizziness, she'll fall down.  They come on while she's sitting, not only standing.

## 2014-09-11 NOTE — Telephone Encounter (Signed)
Received a fax from [facility]; patient scheduled to see Dr. Jannifer Franklin on 09/19/14 @ 11:30.  [facility] has contacted patient and made her aware of this appointment.

## 2014-09-11 NOTE — Telephone Encounter (Signed)
preauthorization for MRI brain approved [id number] . Order placed. Patient to call to schedule appt with [facility] imaging. Left message for patient to call us for information

## 2014-09-11 NOTE — Telephone Encounter (Signed)
[facility] called stated MRI needed to be w/wo contrast instead of with contrast order changed

## 2014-09-11 NOTE — Telephone Encounter (Signed)
Patient returned call information regarding MRI given to patient to call and schedule appt

## 2014-09-17 ENCOUNTER — Inpatient Hospital Stay: Admission: RE | Admit: 2014-09-17 | Payer: Self-pay | Source: Ambulatory Visit

## 2014-09-19 ENCOUNTER — Encounter: Payer: Self-pay | Admitting: Neurology

## 2014-09-19 ENCOUNTER — Ambulatory Visit (INDEPENDENT_AMBULATORY_CARE_PROVIDER_SITE_OTHER): Payer: 59 | Admitting: Neurology

## 2014-09-19 ENCOUNTER — Other Ambulatory Visit: Payer: 59 | Admitting: Internal Medicine

## 2014-09-19 VITALS — BP 138/85 | HR 77 | Ht 62.0 in | Wt 180.0 lb

## 2014-09-19 DIAGNOSIS — R42 Dizziness and giddiness: Secondary | ICD-10-CM | POA: Diagnosis not present

## 2014-09-19 DIAGNOSIS — R55 Syncope and collapse: Secondary | ICD-10-CM

## 2014-09-19 DIAGNOSIS — E785 Hyperlipidemia, unspecified: Secondary | ICD-10-CM

## 2014-09-19 DIAGNOSIS — Z79899 Other long term (current) drug therapy: Secondary | ICD-10-CM

## 2014-09-19 HISTORY — DX: Syncope and collapse: R55

## 2014-09-19 LAB — LIPID PANEL
CHOL/HDL RATIO: 2.9 ratio
Cholesterol: 209 mg/dL — ABNORMAL HIGH (ref 0–200)
HDL: 72 mg/dL (ref >=46)
LDL Cholesterol: 115 mg/dL — ABNORMAL HIGH (ref 0–99)
Triglycerides: 109 mg/dL (ref <150)
VLDL: 22 mg/dL (ref 0–40)

## 2014-09-19 LAB — COMPLETE METABOLIC PANEL WITH GFR
ALK PHOS: 97 U/L (ref 39–117)
ALT: 17 U/L (ref 0–35)
AST: 19 U/L (ref 0–37)
Albumin: 4.2 g/dL (ref 3.5–5.2)
BUN: 23 mg/dL (ref 6–23)
CALCIUM: 9.4 mg/dL (ref 8.4–10.5)
CO2: 30 meq/L (ref 19–32)
Chloride: 101 mEq/L (ref 96–112)
Creat: 0.78 mg/dL (ref 0.50–1.10)
GFR, Est African American: >89 mL/min
GFR, Est Non African American: 82 mL/min
Glucose, Bld: 102 mg/dL — ABNORMAL HIGH (ref 70–99)
Potassium: 4.4 mEq/L (ref 3.5–5.3)
Sodium: 140 mEq/L (ref 135–145)
Total Bilirubin: 0.6 mg/dL (ref 0.2–1.2)
Total Protein: 7.2 g/dL (ref 6.0–8.3)

## 2014-09-19 NOTE — Progress Notes (Signed)
Reason for visit: Syncope  Referring physician: Dr. Derek [name] is a 59 y.o. female  History of present illness:  Carla [name] is a 59 year old right-handed white female with a history of a syncopal event that occurred around 09/05/2014. The patient was working at the time as a Geophysicist/field seismologist. The patient began feeling dizzy, without true vertigo. The patient then suddenly lost consciousness and collapsed for a few moments. The event was witnessed, the patient did not jerk or twitch, she did not bite her tongue or lose control the bowels or the bladder. The patient regained consciousness rapidly without any confusion afterwards. The patient denied any nausea or vomiting, diaphoresis, or sensation of cardiac palpitations. The patient has had some events of dizziness for 10 days or so prior to this blackout that would generally occur while sitting. The episodes lasted less than 1 minute, again unassociated with true vertigo. The patient has not had any dizzy events since the syncopal episode. The patient reports no focal numbness or weakness on the face, arms, or legs which she does have some numbness in the hands associated with her job as a Geophysicist/field seismologist. The patient denies any issues controlling the bowels or the bladder. She has been set up for MRI evaluation of the brain, this has not yet been done. She is sent to this office for an evaluation. The patient does have a history of diabetes. She did not check any blood sugars around the time of any further dizzy episodes or syncopal event. She does not have a significant history of hypoglycemia in the past.  Past Medical History  Diagnosis Date  . Allergy     rhinitis  . Type 2 HSV infection of vulvovaginal region   . Hyperlipidemia   . Diabetes mellitus   . Vitamin D deficiency   . Hypertension   . Metabolic syndrome   . Osteoarthritis   . Allergic rhinitis   . Syncope and collapse 09/19/2014    Past Surgical History    Procedure Laterality Date  . Joint replacement      l hip  . Abdominal hysterectomy    . Phalangectomy      rt 2nd toe  . Total hip arthroplasty      right/02/2011  . Colonoscopy      Family History  Problem Relation Age of Onset  . Hypertension Mother   . Cancer Father   . Mental illness Father   . Parkinson's disease Father   . Diabetes Brother   . Heart disease Maternal Grandfather     Social history:  reports that she has never smoked. She has never used smokeless tobacco. She reports that she drinks alcohol. She reports that she does not use illicit drugs.  Medications:  Prior to Admission medications   Medication Sig Start Date End Date Taking? Authorizing Provider  ALPRAZolam Duanne Moron) 0.25 MG tablet One po qhs 09/08/14  Yes Elby Showers, MD  aspirin 81 MG tablet Take 81 mg by mouth daily.   Yes Historical Provider, MD  atorvastatin (LIPITOR) 20 MG tablet TAKE 1 TABLET (20 MG TOTAL) BY MOUTH DAILY.   Yes Elby Showers, MD  Calcium Carbonate-Vit D-Min (CALCIUM 1200 PO) Take by mouth.     Yes Historical Provider, MD  cetirizine (ZYRTEC) 10 MG tablet Take 10 mg by mouth daily.   Yes Historical Provider, MD  Fish Oil-Cholecalciferol (OMEGA-3 FISH OIL-VITAMIN D3) 1200-1000 MG-UNIT CAPS Take 1 capsule by mouth daily.  Yes Historical Provider, MD  glipiZIDE (GLUCOTROL) 10 MG tablet Take 1 tablet (10 mg total) by mouth 2 (two) times daily before a meal. 08/06/13  Yes Elby Showers, MD  montelukast (SINGULAIR) 10 MG tablet TAKE 1 TABLET BY MOUTH DAILY AT BEDTIME   Yes Elby Showers, MD  ramipril (ALTACE) 5 MG capsule Take 5 mg by mouth 2 (two) times daily.   Yes Historical Provider, MD  UNABLE TO FIND Take 1 capsule by mouth daily. Med Name: Juice festive-2 fruit and 2 vegetable   Yes Historical Provider, MD      Allergies  Allergen Reactions  . Cephalexin Nausea And Vomiting  . Codeine Nausea And Vomiting  . Dust Mite Extract   . Mold Extract [Trichophyton]      ROS:  Out of a complete 14 system review of symptoms, the patient complains only of the following symptoms, and all other reviewed systems are negative.  Fatigue Cough, wheezing Incontinence of the bowel, diarrhea Achy muscles Allergies Dizziness, passing out  Blood pressure 138/85, pulse 77, height 5\' 2"  (1.575 m), weight 180 lb (81.647 kg).  Physical Exam  General: The patient is alert and cooperative at the time of the examination. The patient is moderately obese.  Eyes: Pupils are equal, round, and reactive to light. Discs are flat bilaterally.  Neck: The neck is supple, no carotid bruits are noted.  Respiratory: The respiratory examination is clear.  Cardiovascular: The cardiovascular examination reveals a regular rate and rhythm, no obvious murmurs or rubs are noted.  Skin: Extremities are without significant edema.  Neurologic Exam  Mental status: The patient is alert and oriented x 3 at the time of the examination. The patient has apparent normal recent and remote memory, with an apparently normal attention span and concentration ability.  Cranial nerves: Facial symmetry is present. There is good sensation of the face to pinprick and soft touch bilaterally. The strength of the facial muscles and the muscles to head turning and shoulder shrug are normal bilaterally. Speech is well enunciated, no aphasia or dysarthria is noted. Extraocular movements are full. Visual fields are full. The tongue is midline, and the patient has symmetric elevation of the soft palate. No obvious hearing deficits are noted.  Motor: The motor testing reveals 5 over 5 strength of all 4 extremities. Good symmetric motor tone is noted throughout.  Sensory: Sensory testing is intact to pinprick, soft touch, vibration sensation, and position sense on all 4 extremities. No evidence of extinction is noted.  Coordination: Cerebellar testing reveals good finger-nose-finger and heel-to-shin  bilaterally.  Gait and station: Gait is normal. Tandem gait is normal. Romberg is negative. No drift is seen.  Reflexes: Deep tendon reflexes are symmetric and normal bilaterally. Toes are downgoing bilaterally.   Assessment/Plan:  1. Episodic dizziness, syncope  2. Diabetes  The patient has had an event of syncope that was preceded by events of dizziness while sitting. The patient had a witnessed event, and had a brief event of loss of consciousness without confusion. This likely did not represent a seizure. The patient will undergo MRI evaluation of the brain, I will set the patient up for a prolonged cardiac monitor study to exclude the possibility of cardiac rhythm abnormalities. The patient will try to check a blood sugar if she has another episode of dizziness. The patient indicates that she was under a lot of stress around the time that the episode of syncope occurred.  Jill Alexanders MD 09/20/2014 5:47 PM  [facility] [address] [place], [place] [address]  Phone [phone] Fax [phone]

## 2014-09-19 NOTE — Patient Instructions (Signed)
Syncope °Syncope is a medical term for fainting or passing out. This means you lose consciousness and drop to the ground. People are generally unconscious for less than 5 minutes. You may have some muscle twitches for up to 15 seconds before waking up and returning to normal. Syncope occurs more often in older adults, but it can happen to anyone. While most causes of syncope are not dangerous, syncope can be a sign of a serious medical problem. It is important to seek medical care.  °CAUSES  °Syncope is caused by a sudden drop in blood flow to the brain. The specific cause is often not determined. Factors that can bring on syncope include: °· Taking medicines that lower blood pressure. °· Sudden changes in posture, such as standing up quickly. °· Taking more medicine than prescribed. °· Standing in one place for too long. °· Seizure disorders. °· Dehydration and excessive exposure to heat. °· Low blood sugar (hypoglycemia). °· Straining to have a bowel movement. °· Heart disease, irregular heartbeat, or other circulatory problems. °· Fear, emotional distress, seeing blood, or severe pain. °SYMPTOMS  °Right before fainting, you may: °· Feel dizzy or light-headed. °· Feel nauseous. °· See all white or all black in your field of vision. °· Have cold, clammy skin. °DIAGNOSIS  °Your health care provider will ask about your symptoms, perform a physical exam, and perform an electrocardiogram (ECG) to record the electrical activity of your heart. Your health care provider may also perform other heart or blood tests to determine the cause of your syncope which may include: °· Transthoracic echocardiogram (TTE). During echocardiography, sound waves are used to evaluate how blood flows through your heart. °· Transesophageal echocardiogram (TEE). °· Cardiac monitoring. This allows your health care provider to monitor your heart rate and rhythm in real time. °· Holter monitor. This is a portable device that records your  heartbeat and can help diagnose heart arrhythmias. It allows your health care provider to track your heart activity for several days, if needed. °· Stress tests by exercise or by giving medicine that makes the heart beat faster. °TREATMENT  °In most cases, no treatment is needed. Depending on the cause of your syncope, your health care provider may recommend changing or stopping some of your medicines. °HOME CARE INSTRUCTIONS °· Have someone stay with you until you feel stable. °· Do not drive, use machinery, or play sports until your health care provider says it is okay. °· Keep all follow-up appointments as directed by your health care provider. °· Lie down right away if you start feeling like you might faint. Breathe deeply and steadily. Wait until all the symptoms have passed. °· Drink enough fluids to keep your urine clear or pale yellow. °· If you are taking blood pressure or heart medicine, get up slowly and take several minutes to sit and then stand. This can reduce dizziness. °SEEK IMMEDIATE MEDICAL CARE IF:  °· You have a severe headache. °· You have unusual pain in the chest, abdomen, or back. °· You are bleeding from your mouth or rectum, or you have black or tarry stool. °· You have an irregular or very fast heartbeat. °· You have pain with breathing. °· You have repeated fainting or seizure-like jerking during an episode. °· You faint when sitting or lying down. °· You have confusion. °· You have trouble walking. °· You have severe weakness. °· You have vision problems. °If you fainted, call your local emergency services (911 in [place] Do not drive   yourself to the hospital.  °MAKE SURE YOU: °· Understand these instructions. °· Will watch your condition. °· Will get help right away if you are not doing well or get worse. °Document Released: 04/25/2005 Document Revised: 04/30/2013 Document Reviewed: 06/24/2011 °Exit[employer]® Patient Information ©2015 [employer]. This information is not intended to replace  advice given to you by your health care provider. Make sure you discuss any questions you have with your health care provider. ° °

## 2014-09-22 ENCOUNTER — Ambulatory Visit
Admission: RE | Admit: 2014-09-22 | Discharge: 2014-09-22 | Disposition: A | Discharge status: Home / Self Care | Payer: 59 | Source: Ambulatory Visit | Attending: Internal Medicine | Admitting: Internal Medicine

## 2014-09-22 DIAGNOSIS — R55 Syncope and collapse: Secondary | ICD-10-CM

## 2014-09-22 DIAGNOSIS — H8110 Benign paroxysmal vertigo, unspecified ear: Secondary | ICD-10-CM

## 2014-09-22 MED ORDER — GADOBENATE DIMEGLUMINE 529 MG/ML IV SOLN
17.0000 mL | Freq: Once | INTRAVENOUS | Status: AC | PRN
  Administered 2014-09-22: 17 mL via INTRAVENOUS

## 2014-09-23 ENCOUNTER — Encounter: Payer: Self-pay | Admitting: Internal Medicine

## 2014-09-23 ENCOUNTER — Ambulatory Visit (INDEPENDENT_AMBULATORY_CARE_PROVIDER_SITE_OTHER): Payer: 59 | Admitting: Internal Medicine

## 2014-09-23 VITALS — BP 140/90 | HR 78 | Temp 99.3°F | Ht 62.0 in | Wt 180.0 lb

## 2014-09-23 DIAGNOSIS — E119 Type 2 diabetes mellitus without complications: Secondary | ICD-10-CM

## 2014-09-23 DIAGNOSIS — J069 Acute upper respiratory infection, unspecified: Secondary | ICD-10-CM | POA: Diagnosis not present

## 2014-09-23 DIAGNOSIS — R197 Diarrhea, unspecified: Secondary | ICD-10-CM | POA: Diagnosis not present

## 2014-09-23 DIAGNOSIS — R55 Syncope and collapse: Secondary | ICD-10-CM

## 2014-09-23 DIAGNOSIS — I10 Essential (primary) hypertension: Secondary | ICD-10-CM

## 2014-09-23 DIAGNOSIS — F411 Generalized anxiety disorder: Secondary | ICD-10-CM | POA: Insufficient documentation

## 2014-09-23 MED ORDER — BENZONATATE 200 MG PO CAPS: 200.0000 mg | ORAL_CAPSULE | Freq: Two times a day (BID) | ORAL | Status: DC | PRN

## 2014-09-23 MED ORDER — RAMIPRIL 5 MG PO CAPS: 5.0000 mg | ORAL_CAPSULE | Freq: Two times a day (BID) | ORAL | Status: DC

## 2014-09-23 MED ORDER — AZITHROMYCIN 250 MG PO TABS: ORAL_TABLET | ORAL | Status: DC

## 2014-09-23 MED ORDER — ALPRAZOLAM 0.25 MG PO TABS: 0.2500 mg | ORAL_TABLET | Freq: Two times a day (BID) | ORAL | Status: DC

## 2014-09-23 NOTE — Patient Instructions (Signed)
Take Xanax twice a day and return in 4 months

## 2014-09-23 NOTE — Progress Notes (Signed)
   Subjective:    Patient ID: Carla [name], female    DOB: [birth year], 59 y.o.   MRN: [id number]  HPI She is here to follow-up on syncope, vertigo, hypertension. MRI of the brain with contrast was normal. She saw Dr. Jannifer Franklin. He suggested a Holter monitor but she really can't afford it right nail. Continues to be under a lot of situational stress. Decided to quit one of her jobs recently. That was an unpleasant interaction with gentleman she was working for. Blood pressure is elevated today but she is out of Ramapril. Reviewed lab work with her including hemoglobin A1c of 6.5%. She is taking glipizide. Thinks is causing diarrhea. I think stress is causing diarrhea and perhaps the juice tablet supplements she is taking. I would like for her to stop that at the present time. She may have ear will bowel syndrome. She is not on metformin. CBC and thyroid functions were normal. Lipid panel was stable. She is on Lipitor.  I'm disappointed she ran out of Ramapril. She cannot she had a $5000 deductible with [place] healthcare. She is stressed out about that. Worried about paying for MRI.  She is asking about physical exam. She got a thorough workup for syncope. We are going to defer physical for the present time.  New complaint today is URI symptoms with sore throat and ear pressure.  Review of Systems     Objective:   Physical Exam  Constitutional: She is oriented to person, place, and time. She appears well-developed and well-nourished. No distress.  HENT:  Head: Normocephalic and atraumatic.  Right Ear: External ear normal.  Left Ear: External ear normal.  Mouth/Throat: Oropharynx is clear and moist. No oropharyngeal exudate.  TM slightly full but not red  Eyes: Conjunctivae are normal. Right eye exhibits no discharge. No scleral icterus.  Neck: Neck supple. No JVD present. No thyromegaly present.  Cardiovascular: Normal rate, regular rhythm and normal heart sounds.   No murmur  heard. Pulmonary/Chest: No respiratory distress. She has no wheezes. She has no rales.  Abdominal: Soft. Bowel sounds are normal. She exhibits no distension and no mass. There is no tenderness. There is no rebound and no guarding.  Lymphadenopathy:    She has no cervical adenopathy.  Neurological: She is alert and oriented to person, place, and time. She has normal reflexes. She displays normal reflexes. No cranial nerve deficit. Coordination normal.  Skin: Skin is warm and dry. No rash noted. She is not diaphoretic.  Psychiatric:  Anxious  Vitals reviewed.         Assessment & Plan:  Acute URI-  prescribed Zithromax Z-PAK. At her request refill Tessalon Perles.  Vertigo-likely stress related. Takes Xanax 0.25 mg twice daily  Anxiety-prescribed Xanax 0.25 mg twice daily  Hypertension-has run out of ACE inhibitor. Refill today.  Diabetes mellitus type 2-return in 4 months from his visit blood pressure check and hemoglobin A1c. Encouraged diet and exercise.  Diarrhea-likely irritable bowel syndrome. Discontinue juice tablet that she purchases from [employer]. See if Xanax will help diarrhea.

## 2014-09-25 ENCOUNTER — Telehealth: Payer: Self-pay | Admitting: Internal Medicine

## 2014-09-25 MED ORDER — HYDROCODONE-HOMATROPINE 5-1.5 MG/5ML PO SYRP: 5.0000 mL | ORAL_SOLUTION | Freq: Every evening | ORAL | Status: DC | PRN

## 2014-09-25 NOTE — Telephone Encounter (Signed)
You can have her pick up Rx for Hycodan. Cannot call in RX. She has Tessalon perles.

## 2014-09-25 NOTE — Telephone Encounter (Signed)
She wants to know if you will call her something in for the cough at night.  She can't get any sleep at bedtime.  She also needs a refill on her Xanax (although it shows that it was sent on 5/17).  [facility]:  [facility] on [address]

## 2014-09-25 NOTE — Telephone Encounter (Signed)
Spoke with patient and advised we have Rx ready for her to pick up.

## 2014-10-07 ENCOUNTER — Other Ambulatory Visit: Payer: Self-pay | Admitting: Internal Medicine

## 2014-10-27 ENCOUNTER — Other Ambulatory Visit: Payer: Self-pay | Admitting: Internal Medicine

## 2014-12-05 ENCOUNTER — Encounter: Payer: Self-pay | Admitting: *Deleted

## 2014-12-16 ENCOUNTER — Telehealth: Payer: Self-pay | Admitting: *Deleted

## 2014-12-16 NOTE — Telephone Encounter (Signed)
Patient and husband to be seen in office tomorrow

## 2014-12-16 NOTE — Telephone Encounter (Signed)
Patient states she accidentally stuck herself with her husbands used insulin syringe . She wants to know if she needs to be seen and tested since he has Hep

## 2014-12-17 ENCOUNTER — Ambulatory Visit (INDEPENDENT_AMBULATORY_CARE_PROVIDER_SITE_OTHER): Payer: 59 | Admitting: Internal Medicine

## 2014-12-17 ENCOUNTER — Encounter: Payer: Self-pay | Admitting: Internal Medicine

## 2014-12-17 VITALS — BP 118/74 | HR 70 | Temp 97.9°F | Wt 174.5 lb

## 2014-12-17 DIAGNOSIS — Z113 Encounter for screening for infections with a predominantly sexual mode of transmission: Secondary | ICD-10-CM

## 2014-12-17 DIAGNOSIS — W461XXA Contact with contaminated hypodermic needle, initial encounter: Secondary | ICD-10-CM

## 2014-12-17 DIAGNOSIS — Z9189 Other specified personal risk factors, not elsewhere classified: Secondary | ICD-10-CM | POA: Diagnosis not present

## 2014-12-17 DIAGNOSIS — Z7721 Contact with and (suspected) exposure to potentially hazardous body fluids: Principal | ICD-10-CM

## 2014-12-17 LAB — HEPATITIS B SURFACE ANTIGEN: Hepatitis B Surface Ag: NEGATIVE

## 2014-12-17 LAB — HEPATITIS B SURFACE ANTIBODY,QUALITATIVE: Hep B S Ab: POSITIVE — AB

## 2014-12-17 LAB — HEPATITIS C ANTIBODY: HCV Ab: NEGATIVE

## 2014-12-17 LAB — HEPATITIS B CORE ANTIBODY, TOTAL: Hep B Core Total Ab: REACTIVE — AB

## 2014-12-17 NOTE — Patient Instructions (Signed)
For needlestick exposure, you're being screened for hepatitis C, HIV, hepatitis B. Tetanus immunization is up-to-date. Suggest she take hepatitis B series in the future pending these results.

## 2014-12-17 NOTE — Progress Notes (Signed)
   Subjective:    Patient ID: Carla [name], female    DOB: 1955/08/07, 59 y.o.   MRN: [id number]  HPI 59 year old female whose husband has chronic hepatitis B followed by Dr. Fuller Plan and Dr. freed at [facility]. In 2015 he was screened and was found to have a negative hepatitis B E antigen. In 2013 he had a negative hepatitis C antibody. Note from Dr. freed in 2015 indicated he was treated with tenofovir. Had a negative HIV test in 2013. They are seldom sexually active anymore because he has erectile dysfunction.  Apparently on Monday, August 8, she was cleaning up a kitchen counter and accidentally stuck herself with one of his insulin pins. Thinks it was the right third or fourth finger. Her tetanus immunization is up-to-date. 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  Tetanus immunization is up-to-date.    Review of Systems     Objective:   Physical Exam  Not examined. Screen today for hepatitis C, HIV, hepatitis B surface antibody, surface antigen, core antibody.      Assessment & Plan:  Needlestick  Plan: Await results and further recommendations to follow. Suggest she get hepatitis B  series. Husband is being screened with hepatitis  B  e antigen, hep C, HIV. Patient may need HBIG

## 2014-12-18 ENCOUNTER — Telehealth: Payer: Self-pay | Admitting: *Deleted

## 2014-12-18 LAB — HIV ANTIBODY (ROUTINE TESTING W REFLEX): HIV: NONREACTIVE

## 2014-12-19 NOTE — Telephone Encounter (Signed)
Reviewed lab results with patient.

## 2015-01-22 ENCOUNTER — Other Ambulatory Visit: Payer: Self-pay | Admitting: Internal Medicine

## 2015-01-22 DIAGNOSIS — E1110 Type 2 diabetes mellitus with ketoacidosis without coma: Secondary | ICD-10-CM

## 2015-01-23 ENCOUNTER — Other Ambulatory Visit: Payer: 59 | Admitting: Internal Medicine

## 2015-01-23 DIAGNOSIS — E1110 Type 2 diabetes mellitus with ketoacidosis without coma: Secondary | ICD-10-CM

## 2015-01-24 LAB — HEMOGLOBIN A1C
Hgb A1c MFr Bld: 6.1 % — ABNORMAL HIGH (ref <5.7)
Mean Plasma Glucose: 128 mg/dL — ABNORMAL HIGH (ref <117)

## 2015-01-26 ENCOUNTER — Encounter: Payer: Self-pay | Admitting: Internal Medicine

## 2015-01-26 ENCOUNTER — Ambulatory Visit (INDEPENDENT_AMBULATORY_CARE_PROVIDER_SITE_OTHER): Payer: 59 | Admitting: Internal Medicine

## 2015-01-26 VITALS — BP 110/72 | HR 73 | Temp 98.7°F | Ht 62.0 in | Wt 169.0 lb

## 2015-01-26 DIAGNOSIS — H8110 Benign paroxysmal vertigo, unspecified ear: Secondary | ICD-10-CM | POA: Diagnosis not present

## 2015-01-26 DIAGNOSIS — E8881 Metabolic syndrome: Secondary | ICD-10-CM

## 2015-01-26 DIAGNOSIS — E119 Type 2 diabetes mellitus without complications: Secondary | ICD-10-CM | POA: Diagnosis not present

## 2015-01-26 DIAGNOSIS — Z23 Encounter for immunization: Secondary | ICD-10-CM

## 2015-01-26 DIAGNOSIS — E669 Obesity, unspecified: Secondary | ICD-10-CM

## 2015-01-26 DIAGNOSIS — I10 Essential (primary) hypertension: Secondary | ICD-10-CM | POA: Diagnosis not present

## 2015-01-26 NOTE — Patient Instructions (Signed)
Congratulations on weight loss. Continue diet and exercise efforts. Return in March 2017. Flu vaccine given today. Continue same medications.

## 2015-01-26 NOTE — Progress Notes (Signed)
   Subjective:    Patient ID: Carla [name], female    DOB: [birth year], 59 y.o.   MRN: [id number]  HPI  For follow up on Diabetes and HTN, obesity, and recent vertigo. No more episodes of vertigo. Has change work situation and is not under as much stress. What is stressful is dealing with noncompliant diabetic husband. He has multiple medical problems. She does take Xanax for anxiety. Hemoglobin A1c has improved from 6.5% to 6.1%. 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 She's lost 11 pounds. Blood pressure is excellent. Needs to continue ACE inhibitor.    Review of Systems     Objective:   Physical Exam Neck is supple without JVD thyromegaly or carotid bruits. Chest clear to auscultation. Cardiac exam regular rate and rhythm.       Assessment & Plan:  Essential hypertension-stable on current regimen  Obesity-has lost 11 pounds on the diet  Type 2 diabetes mellitus-improved hemoglobin A1c with diet  Metabolic syndrome  Benign positional vertigo-resolved  Plan: Return in April 2017 for physical examination. Flu vaccine given.

## 2015-04-08 ENCOUNTER — Other Ambulatory Visit: Payer: Self-pay | Admitting: Internal Medicine

## 2015-04-30 ENCOUNTER — Other Ambulatory Visit: Payer: Self-pay

## 2015-04-30 MED ORDER — MONTELUKAST SODIUM 10 MG PO TABS: 10.0000 mg | ORAL_TABLET | Freq: Every day | ORAL | Status: DC

## 2015-05-05 ENCOUNTER — Ambulatory Visit (INDEPENDENT_AMBULATORY_CARE_PROVIDER_SITE_OTHER): Payer: 59 | Admitting: Internal Medicine

## 2015-05-05 ENCOUNTER — Encounter: Payer: Self-pay | Admitting: Internal Medicine

## 2015-05-05 VITALS — BP 144/70 | HR 84 | Temp 98.4°F | Resp 20 | Ht 62.0 in | Wt 167.0 lb

## 2015-05-05 DIAGNOSIS — J069 Acute upper respiratory infection, unspecified: Secondary | ICD-10-CM | POA: Diagnosis not present

## 2015-05-05 DIAGNOSIS — H6503 Acute serous otitis media, bilateral: Secondary | ICD-10-CM | POA: Diagnosis not present

## 2015-05-05 MED ORDER — AZITHROMYCIN 250 MG PO TABS: ORAL_TABLET | ORAL | Status: DC

## 2015-05-05 MED ORDER — BENZONATATE 100 MG PO CAPS: 200.0000 mg | ORAL_CAPSULE | Freq: Three times a day (TID) | ORAL | Status: DC

## 2015-05-05 MED ORDER — HYDROCODONE-HOMATROPINE 5-1.5 MG/5ML PO SYRP: 5.0000 mL | ORAL_SOLUTION | Freq: Three times a day (TID) | ORAL | Status: DC | PRN

## 2015-05-05 NOTE — Patient Instructions (Signed)
Rest and drink plenty of fluids. Take Zithromax Z-PAK as directed. If not better in 7-10 days have prescription refill. Hycodan as needed for cough at night. Tessalon Perles as needed for cough during the day.

## 2015-05-05 NOTE — Progress Notes (Signed)
   Subjective:    Patient ID: Carla [name], female    DOB: [birth year], 59 y.o.   MRN: [id number]  HPI Onset about a week ago of URI symptoms that have progressively gotten worse. Now has ear pain and cough that is slightly productive. No fever or shaking chills. No wheezing.    Review of Systems     Objective:   Physical Exam Skin warm and dry. TMs are red and full bilaterally. Pharynx slightly injected without exudate. Neck is supple without adenopathy. Chest is clear to auscultation without rales or wheezing.       Assessment & Plan:  Acute bilateral serous otitis media  Acute URI  Plan: Zithromax Z-Pak take 2 tablets day one followed by 1 tablet days 2 through 5 with 1 refill. If not better in 7-10 days have prescription refilled. Hycodan 1 teaspoon by mouth at night for cough. Tessalon Perles 200 mg during the day for cough 2 by mouth 3 times daily. Rest and drink plenty of fluids.

## 2015-05-10 ENCOUNTER — Other Ambulatory Visit: Payer: Self-pay | Admitting: Internal Medicine

## 2015-05-21 ENCOUNTER — Emergency Department (HOSPITAL_COMMUNITY): Payer: BLUE CROSS/BLUE SHIELD

## 2015-05-21 ENCOUNTER — Encounter (HOSPITAL_COMMUNITY): Payer: Self-pay | Admitting: *Deleted

## 2015-05-21 ENCOUNTER — Emergency Department (HOSPITAL_COMMUNITY)
Admission: EM | Admit: 2015-05-21 | Discharge: 2015-05-21 | Disposition: A | Discharge status: Home / Self Care | Payer: BLUE CROSS/BLUE SHIELD | Attending: Emergency Medicine | Admitting: Emergency Medicine

## 2015-05-21 DIAGNOSIS — Y9289 Other specified places as the place of occurrence of the external cause: Secondary | ICD-10-CM | POA: Insufficient documentation

## 2015-05-21 DIAGNOSIS — E785 Hyperlipidemia, unspecified: Secondary | ICD-10-CM | POA: Insufficient documentation

## 2015-05-21 DIAGNOSIS — Z23 Encounter for immunization: Secondary | ICD-10-CM | POA: Insufficient documentation

## 2015-05-21 DIAGNOSIS — Y998 Other external cause status: Secondary | ICD-10-CM | POA: Insufficient documentation

## 2015-05-21 DIAGNOSIS — I10 Essential (primary) hypertension: Secondary | ICD-10-CM | POA: Diagnosis not present

## 2015-05-21 DIAGNOSIS — W01198A Fall on same level from slipping, tripping and stumbling with subsequent striking against other object, initial encounter: Secondary | ICD-10-CM | POA: Diagnosis not present

## 2015-05-21 DIAGNOSIS — S8992XA Unspecified injury of left lower leg, initial encounter: Secondary | ICD-10-CM

## 2015-05-21 DIAGNOSIS — Y9389 Activity, other specified: Secondary | ICD-10-CM | POA: Diagnosis not present

## 2015-05-21 DIAGNOSIS — E119 Type 2 diabetes mellitus without complications: Secondary | ICD-10-CM | POA: Insufficient documentation

## 2015-05-21 DIAGNOSIS — S81012A Laceration without foreign body, left knee, initial encounter: Secondary | ICD-10-CM | POA: Insufficient documentation

## 2015-05-21 DIAGNOSIS — Z8619 Personal history of other infectious and parasitic diseases: Secondary | ICD-10-CM | POA: Insufficient documentation

## 2015-05-21 DIAGNOSIS — Z8739 Personal history of other diseases of the musculoskeletal system and connective tissue: Secondary | ICD-10-CM | POA: Diagnosis not present

## 2015-05-21 DIAGNOSIS — Z79899 Other long term (current) drug therapy: Secondary | ICD-10-CM | POA: Diagnosis not present

## 2015-05-21 MED ORDER — LIDOCAINE-EPINEPHRINE (PF) 2 %-1:200000 IJ SOLN
20.0000 mL | Freq: Once | INTRAMUSCULAR | Status: AC
  Administered 2015-05-21: 20 mL
  Filled 2015-05-21: qty 20

## 2015-05-21 MED ORDER — TETANUS-DIPHTH-ACELL PERTUSSIS 5-2.5-18.5 LF-MCG/0.5 IM SUSP
0.5000 mL | Freq: Once | INTRAMUSCULAR | Status: AC
  Administered 2015-05-21: 0.5 mL via INTRAMUSCULAR
  Filled 2015-05-21: qty 0.5

## 2015-05-21 MED ORDER — IBUPROFEN 800 MG PO TABS
800.0000 mg | ORAL_TABLET | Freq: Once | ORAL | Status: AC
  Administered 2015-05-21: 800 mg via ORAL
  Filled 2015-05-21: qty 1

## 2015-05-21 MED ORDER — LIDOCAINE-EPINEPHRINE (PF) 2 %-1:200000 IJ SOLN: 10.0000 mL | Freq: Once | INTRAMUSCULAR | Status: DC

## 2015-05-21 NOTE — ED Provider Notes (Signed)
CSN: [id number]     Arrival date & time 05/21/15  1956 History  By signing my name below, I, Emmanuella Mensah, attest that this documentation has been prepared under the direction and in the presence of [employer], PA-C. Electronically Signed: Judithann Sauger, ED Scribe. 05/21/2015. 11:08 PM.    Chief Complaint  Patient presents with  . Knee Injury   The history is provided by the patient and medical records. No language interpreter was used.   HPI Comments: Carla [name] is a 60 y.o. female with a hx of DM and bilateral hip replacement who presents to the Emergency Department complaining of a moderately painful left knee laceration s/p fall that occurred PTA. She explains that she was on a handicap ramp where she slipped and fell, hitting her left knee on the ground. She states that she cleaned the area and presents with a bandage on it. She denies taking any medication for the pain. She also denies currently being on blood thinners. Pt is unsure of her last Tetanus vaccine. She reports an allergy to Codeine and Cephalexin. She denies any gait problems.     Past Medical History  Diagnosis Date  . Allergy     rhinitis  . Type 2 HSV infection of vulvovaginal region   . Hyperlipidemia   . Diabetes mellitus   . Vitamin D deficiency   . Hypertension   . Metabolic syndrome   . Osteoarthritis   . Allergic rhinitis   . Syncope and collapse 09/19/2014   Past Surgical History  Procedure Laterality Date  . Joint replacement      l hip  . Abdominal hysterectomy    . Phalangectomy      rt 2nd toe  . Total hip arthroplasty      right/02/2011  . Colonoscopy     Family History  Problem Relation Age of Onset  . Hypertension Mother   . Cancer Father   . Mental illness Father   . Parkinson's disease Father   . Diabetes Brother   . Heart disease Maternal Grandfather    Social History  Substance Use Topics  . Smoking status: Never Smoker   . Smokeless tobacco: Never Used  .  Alcohol Use: 0.0 oz/week    0 Standard drinks or equivalent per week     Comment: occasional wine   OB History    No data available     Review of Systems  Constitutional: Negative for fever and chills.  Gastrointestinal: Negative for nausea and vomiting.  Musculoskeletal: Positive for joint swelling and arthralgias (left knee ). Negative for back pain, gait problem, neck pain and neck stiffness.  Skin: Positive for wound.  Allergic/Immunologic: Negative for immunocompromised state.  Neurological: Negative for weakness and numbness.  Hematological: Does not bruise/bleed easily.  Psychiatric/Behavioral: The patient is not nervous/anxious.   All other systems reviewed and are negative.     Allergies  Cephalexin; Codeine; Dust mite extract; and Mold extract  Home Medications   Prior to Admission medications   Medication Sig Start Date End Date Taking? Authorizing Provider  ALPRAZolam (XANAX) 0.25 MG tablet Take 1 tablet (0.25 mg total) by mouth 2 (two) times daily. One po qhs Patient taking differently: Take 0.125 mg by mouth 2 (two) times daily.  09/23/14  Yes Elby Showers, MD  atorvastatin (LIPITOR) 20 MG tablet TAKE 1 TABLET BY MOUTH DAILY 05/12/15  Yes Elby Showers, MD  cetirizine (ZYRTEC) 10 MG tablet Take 10 mg by mouth daily.  Yes Historical Provider, MD  Cyanocobalamin (VITAMIN B-12 PO) Take 1 tablet by mouth daily.   Yes Historical Provider, MD  glipiZIDE (GLUCOTROL) 10 MG tablet Take 1 tablet (10 mg total) by mouth 2 (two) times daily before a meal. 08/06/13  Yes Elby Showers, MD  HYDROcodone-homatropine [facility]) 5-1.5 MG/5ML syrup Take 5 mLs by mouth every 8 (eight) hours as needed for cough. 05/05/15  Yes Elby Showers, MD  montelukast (SINGULAIR) 10 MG tablet Take 1 tablet (10 mg total) by mouth at bedtime. 04/30/15  Yes Elby Showers, MD  Multiple Vitamins-Minerals (MULTIVITAMIN WITH MINERALS) tablet Take 1 tablet by mouth daily.   Yes Historical Provider, MD   ramipril (ALTACE) 5 MG capsule TAKE 1 CAPSULE BY MOUTH TWICE DAILY 04/08/15  Yes Elby Showers, MD  azithromycin (ZITHROMAX) 250 MG tablet 2 po day 1 followed by one po days 2-5 Patient not taking: Reported on 05/21/2015 05/05/15   Elby Showers, MD  benzonatate (TESSALON) 100 MG capsule Take 2 capsules (200 mg total) by mouth 3 (three) times daily. Patient not taking: Reported on 05/21/2015 05/05/15   Elby Showers, MD   BP 122/69 mmHg  Pulse 94  Temp(Src) 98.1 F (36.7 C) (Oral)  Resp 20  SpO2 97% Physical Exam  Constitutional: She is oriented to person, place, and time. She appears well-developed and well-nourished. No distress.  HENT:  Head: Normocephalic and atraumatic.  Eyes: Conjunctivae are normal. No scleral icterus.  Neck: Normal range of motion.  Cardiovascular: Normal rate, regular rhythm, normal heart sounds and intact distal pulses.   No murmur heard. Capillary refill < 3 sec  Pulmonary/Chest: Effort normal and breath sounds normal. No respiratory distress.  Musculoskeletal: Normal range of motion. She exhibits no edema.  Full ROM of the left knee, left hip, and left lower leg.  Full extension of the left knee Left knee TTP over the patellar with 4 cm laceration, no abnormal patellar movement, joint line tenderness, ecchymosis, or deformity.   Neurological: She is alert and oriented to person, place, and time.  Sensation intact in LLE Strength: 5/5 in LLE  Skin: Skin is warm and dry. She is not diaphoretic.  Psychiatric: She has a normal mood and affect.  Nursing note and vitals reviewed.   ED Course  Procedures (including critical care time) DIAGNOSTIC STUDIES: Oxygen Saturation is 97% on RA, adequate by my interpretation.    COORDINATION OF CARE: 9:30 PM- Pt advised of plan for treatment and pt agrees. Pt will receive a laceration repair. Recommended to ice on area.   Dg Knee Complete 4 Views Left  05/21/2015  CLINICAL DATA:  Acute left knee pain after  slipping coming out of work today. Initial encounter. EXAM: LEFT KNEE - COMPLETE 4+ VIEW COMPARISON:  None. FINDINGS: There is no evidence of fracture, dislocation, or joint effusion. There is no evidence of arthropathy or other focal bone abnormality. Soft tissue gas is noted anterior to patella consistent with traumatic injury. IMPRESSION: No fracture or dislocation is noted. Electronically Signed   By: Marijo Conception, M.D.   On: 05/21/2015 21:22    LACERATION REPAIR PROCEDURE NOTE The patient's identification was confirmed and consent was obtained. This procedure was performed by Abigail Butts, PA-C at 10:44 PM. Site: left knee.  Sterile procedures observed Anesthetic used (type and amt): 7 cc of 2% Xylocaine with epi.  Suture type/size:4.0 Prolene  Length:3.5 cm # of Sutures: 4  Technique:Horizontal mattress Complexity  Antibx ointment applied  Tetanus ordered Site anesthetized, irrigated with NS, explored without evidence of foreign body, wound well approximated, site covered with dry, sterile dressing.  Patient tolerated procedure well without complications. Instructions for care discussed verbally and patient provided with additional written instructions for homecare and f/u.   MDM   Final diagnoses:  Knee injury, left, initial encounter  Laceration of left knee, initial encounter    Carla [name] presents with left knee injury and laceration.  Pressure irrigation performed. Wound explored and base of wound visualized in a bloodless field without evidence of foreign body.  Laceration occurred < 8 hours prior to repair which was well tolerated. Tdap updated.  Pt has no comorbidities to effect normal wound healing. Pt discharged without antibiotics. Soma reviewed and no evidence of fracture. No abnormal patellar movement; patellar tendon intact.  Knee sleeve placed. Discussed suture home care with patient and answered questions. Pt to follow-up for wound check and suture  removal in 7 days; they are to return to the ED sooner for signs of infection. Pt is hemodynamically stable with no complaints prior to dc.   I personally performed the services described in this documentation, which was scribed in my presence. The recorded information has been reviewed and is accurate.   Jarrett Soho Jasani Lengel, PA-C 05/21/15 [place], MD 05/22/15 [id number]

## 2015-05-21 NOTE — ED Notes (Signed)
Pt states that she slipped in a handicapped ramp and struck her left knee; pt c/o left knee pain and a laceration to left knee

## 2015-05-21 NOTE — Discharge Instructions (Signed)
1. Medications: Tylenol or ibuprofen for pain, usual home medications 2. Treatment: ice for swelling, keep wound clean with warm soap and water and keep bandage dry, do not submerge in water for 24 hours; ice and elevate; use knee brace for comfort and while stitches are in place 3. Follow Up: Please return in 7 days to have your stitches/staples removed or sooner if you have concerns. Return to the emergency department for increased redness, drainage of pus from the wound   WOUND CARE  Keep area clean and dry for 24 hours. Do not remove bandage, if applied.  After 24 hours, remove bandage and wash wound gently with mild soap and warm water. Reapply a new bandage after cleaning wound, if directed.   Continue daily cleansing with soap and water until stitches/staples are removed.  Do not apply any ointments or creams to the wound while stitches/staples are in place, as this may cause delayed healing. Return if you experience any of the following signs of infection: Swelling, redness, pus drainage, streaking, fever >101.0 F  Return if you experience excessive bleeding that does not stop after 15-20 minutes of constant, firm pressure.

## 2015-05-23 ENCOUNTER — Other Ambulatory Visit: Payer: Self-pay | Admitting: Internal Medicine

## 2015-05-24 NOTE — Telephone Encounter (Signed)
Refill x 6 months 

## 2015-05-25 ENCOUNTER — Encounter: Payer: Self-pay | Admitting: Internal Medicine

## 2015-05-25 ENCOUNTER — Ambulatory Visit (INDEPENDENT_AMBULATORY_CARE_PROVIDER_SITE_OTHER): Payer: BLUE CROSS/BLUE SHIELD | Admitting: Internal Medicine

## 2015-05-25 VITALS — BP 106/64 | HR 87 | Temp 99.4°F | Resp 20 | Wt 164.0 lb

## 2015-05-25 DIAGNOSIS — S81012D Laceration without foreign body, left knee, subsequent encounter: Secondary | ICD-10-CM | POA: Diagnosis not present

## 2015-05-25 DIAGNOSIS — M25562 Pain in left knee: Secondary | ICD-10-CM

## 2015-05-25 DIAGNOSIS — S8002XD Contusion of left knee, subsequent encounter: Secondary | ICD-10-CM | POA: Diagnosis not present

## 2015-05-25 MED ORDER — CYCLOBENZAPRINE HCL 10 MG PO TABS: 10.0000 mg | ORAL_TABLET | Freq: Three times a day (TID) | ORAL | Status: DC | PRN

## 2015-05-25 MED ORDER — HYDROCODONE-ACETAMINOPHEN 10-325 MG PO TABS: 1.0000 | ORAL_TABLET | Freq: Three times a day (TID) | ORAL | Status: DC | PRN

## 2015-05-25 NOTE — Progress Notes (Signed)
   Subjective:    Patient ID: Carla [name], female    DOB: 09/30/1955, 60 y.o.   MRN: [id number]  HPI 60 year old White Female with history of diabetes mellitus, obesity, and bilateral hip replacements who presented to the Emergency Department January 12 after suffering a fall on a handicap access parking space at work that was slippery. It had been raining. She struck her left knee on the cement. She suffered a laceration which required 4 sutures in the emergency department. The knee was sore and has remained sore tender and red. She had negative x-rays of the left knee in the emergency department. She's been having considerable amount of pain in her left knee. Has been taking husband's narcotic pain medication and some Flexeril. Difficulty sleeping at night. She is ambulating with a walker. Says it's harder to walk with a cane than with the walker. Trouble bearing weight and navigating steps.  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  She is concerned about possible infection.    Review of Systems     Objective:   Physical Exam No drainage from the laceration. The surrounding area about the laceration is red with slight increased warmth. Decreased range of motion with flexion. Quadriceps inhibition is present. Swelling about the laceration. Sutures in place. This is a jagged laceration that includes an abraded area. Tetanus immunization update was given in the emergency department.       Assessment & Plan:  Left knee contusion-with considerable pain and decreased range of motion due to pain and injury leading to issues with ambulation  Laceration left knee-4 sutures with 4-0 Prolene according to ED note. Associated abrasion left knee as well.  Controlled Type 2 diabetes mellitus  Plan: Patient has an appointment to see Orthopedist tomorrow for follow-up. She may need physical therapy and further evaluation. Have given her prescription  for Norco 10/325 and prescription for Flexeril which she can take at night to help her sleep.

## 2015-05-25 NOTE — Patient Instructions (Signed)
Take Norco 10/325 one by mouth every 8 hours when necessary pain. Flexeril 10 mg at bedtime for sleep. To see orthopedist tomorrow.

## 2015-05-25 NOTE — Telephone Encounter (Signed)
Phoned to [place]

## 2015-05-28 ENCOUNTER — Ambulatory Visit: Payer: Self-pay | Admitting: Internal Medicine

## 2015-06-29 ENCOUNTER — Ambulatory Visit (INDEPENDENT_AMBULATORY_CARE_PROVIDER_SITE_OTHER): Payer: BLUE CROSS/BLUE SHIELD | Admitting: Internal Medicine

## 2015-06-29 ENCOUNTER — Encounter: Payer: Self-pay | Admitting: Internal Medicine

## 2015-06-29 VITALS — BP 140/78 | HR 84 | Temp 98.2°F | Resp 20 | Ht 62.0 in | Wt 165.5 lb

## 2015-06-29 DIAGNOSIS — J069 Acute upper respiratory infection, unspecified: Secondary | ICD-10-CM | POA: Diagnosis not present

## 2015-06-29 DIAGNOSIS — H6503 Acute serous otitis media, bilateral: Secondary | ICD-10-CM

## 2015-06-29 DIAGNOSIS — R002 Palpitations: Secondary | ICD-10-CM

## 2015-06-29 DIAGNOSIS — S8002XS Contusion of left knee, sequela: Secondary | ICD-10-CM

## 2015-06-29 MED ORDER — BENZONATATE 100 MG PO CAPS: 200.0000 mg | ORAL_CAPSULE | Freq: Three times a day (TID) | ORAL | Status: DC | PRN

## 2015-06-29 MED ORDER — AZITHROMYCIN 250 MG PO TABS
ORAL_TABLET | ORAL | Status: DC
Start: 2015-06-29 — End: 2015-08-10

## 2015-06-29 MED ORDER — HYDROCODONE-HOMATROPINE 5-1.5 MG/5ML PO SYRP: 5.0000 mL | ORAL_SOLUTION | Freq: Three times a day (TID) | ORAL | Status: DC | PRN

## 2015-06-29 NOTE — Patient Instructions (Signed)
Take Zithromax Z-Pak and cough medication as prescribed. Rest and drink plenty of fluids. Tessalon Perles during the day. Hycodan at night. Follow-up with orthopedist regarding knee hematoma.

## 2015-06-29 NOTE — Progress Notes (Signed)
   Subjective:    Patient ID: Carla [name], female    DOB: 08-11-1955, 60 y.o.   MRN: [id number]  HPI Saw orthopedist recently after having knee laceration. Was diagnosed with hematoma behind patella. This is slowly resolving after being out of work for 2 weeks. Says she is out of hydrocodone/APAP for pain but I do not want continue that at this point. That can be prescribed a orthopedist. She has follow-up appointment soon. Has come down with URI symptoms recently. She just returned to work last week. No fever or chills. Has cough and congestion. Has malaise and fatigue. No flulike symptoms. Some wheezing.    Review of Systems see above     Objective:   Physical Exam  Skin warm and dry. Nodes none. Pharynx slightly injected. TMs slightly full bilaterally. Neck is supple without adenopathy. Chest clear to auscultation without rales or wheezing.      Assessment & Plan:  Acute URI  Bilateral serous otitis media  Plan: Hycodan 1 teaspoon by mouth every 8 hours when necessary cough. Tessalon Perles 200 mg 3 times daily as needed for cough. Zithromax Z-PAK take 2 tablets day one followed by 1 tablet days 2 through 5.

## 2015-06-30 ENCOUNTER — Telehealth: Payer: Self-pay

## 2015-06-30 NOTE — Telephone Encounter (Signed)
Call in Tamiflu 75 mg twice a day x 5days and call pt please.

## 2015-06-30 NOTE — Addendum Note (Signed)
Addended by: Beryle Quant on: 06/30/2015 10:38 AM   Modules accepted: Orders

## 2015-06-30 NOTE — Telephone Encounter (Signed)
Patient contacted office and states that she is running a temp of 101.5 and has chills, body aches and her head is hurting. She states that she thinks she has the flu and that Dr. Renold Genta should probably call her. Please advise.

## 2015-07-01 MED ORDER — OSELTAMIVIR PHOSPHATE 75 MG PO CAPS: 75.0000 mg | ORAL_CAPSULE | Freq: Two times a day (BID) | ORAL | Status: DC

## 2015-07-01 NOTE — Telephone Encounter (Signed)
Sent to pharmacy 

## 2015-07-01 NOTE — Telephone Encounter (Signed)
Patient notified

## 2015-08-07 ENCOUNTER — Other Ambulatory Visit: Payer: BLUE CROSS/BLUE SHIELD | Admitting: Internal Medicine

## 2015-08-07 DIAGNOSIS — Z0000 Encounter for general adult medical examination without abnormal findings: Secondary | ICD-10-CM

## 2015-08-07 DIAGNOSIS — E785 Hyperlipidemia, unspecified: Secondary | ICD-10-CM

## 2015-08-07 DIAGNOSIS — Z130 Encounter for screening for diseases of the blood and blood-forming organs and certain disorders involving the immune mechanism: Secondary | ICD-10-CM

## 2015-08-07 DIAGNOSIS — E119 Type 2 diabetes mellitus without complications: Secondary | ICD-10-CM

## 2015-08-07 DIAGNOSIS — Z1329 Encounter for screening for other suspected endocrine disorder: Secondary | ICD-10-CM

## 2015-08-07 DIAGNOSIS — Z79899 Other long term (current) drug therapy: Secondary | ICD-10-CM

## 2015-08-07 DIAGNOSIS — Z1321 Encounter for screening for nutritional disorder: Secondary | ICD-10-CM

## 2015-08-07 LAB — CBC WITH DIFFERENTIAL/PLATELET
BASOS ABS: 0.1 10*3/uL (ref 0.0–0.1)
Basophils Relative: 1 % (ref 0–1)
Eosinophils Absolute: 0.2 10*3/uL (ref 0.0–0.7)
Eosinophils Relative: 3 % (ref 0–5)
HEMATOCRIT: 39.1 % (ref 36.0–46.0)
HEMOGLOBIN: 12.5 g/dL (ref 12.0–15.0)
LYMPHS ABS: 2.4 10*3/uL (ref 0.7–4.0)
LYMPHS PCT: 33 % (ref 12–46)
MCH: 29 pg (ref 26.0–34.0)
MCHC: 32 g/dL (ref 30.0–36.0)
MCV: 90.7 fL (ref 78.0–100.0)
MONO ABS: 0.6 10*3/uL (ref 0.1–1.0)
MPV: 10 fL (ref 8.6–12.4)
Monocytes Relative: 9 % (ref 3–12)
Neutro Abs: 3.9 10*3/uL (ref 1.7–7.7)
Neutrophils Relative %: 54 % (ref 43–77)
Platelets: 311 10*3/uL (ref 150–400)
RBC: 4.31 MIL/uL (ref 3.87–5.11)
RDW: 14.5 % (ref 11.5–15.5)
WBC: 7.2 10*3/uL (ref 4.0–10.5)

## 2015-08-07 LAB — TSH: TSH: 2.94 mIU/L

## 2015-08-07 LAB — LIPID PANEL
Cholesterol: 184 mg/dL (ref 125–200)
HDL: 65 mg/dL (ref >=46)
LDL CALC: 96 mg/dL (ref <130)
TRIGLYCERIDES: 114 mg/dL (ref <150)
Total CHOL/HDL Ratio: 2.8 Ratio (ref <=5.0)
VLDL: 23 mg/dL (ref <30)

## 2015-08-07 LAB — COMPLETE METABOLIC PANEL WITH GFR
ALBUMIN: 4.3 g/dL (ref 3.6–5.1)
ALK PHOS: 99 U/L (ref 33–130)
ALT: 16 U/L (ref 6–29)
AST: 19 U/L (ref 10–35)
BILIRUBIN TOTAL: 0.6 mg/dL (ref 0.2–1.2)
BUN: 22 mg/dL (ref 7–25)
CALCIUM: 9.2 mg/dL (ref 8.6–10.4)
CO2: 28 mmol/L (ref 20–31)
CREATININE: 0.72 mg/dL (ref 0.50–0.99)
Chloride: 103 mmol/L (ref 98–110)
GFR, Est Non African American: >89 mL/min (ref >=60)
Glucose, Bld: 100 mg/dL — ABNORMAL HIGH (ref 65–99)
Potassium: 4.5 mmol/L (ref 3.5–5.3)
Sodium: 140 mmol/L (ref 135–146)
TOTAL PROTEIN: 6.7 g/dL (ref 6.1–8.1)

## 2015-08-07 LAB — HEMOGLOBIN A1C
HEMOGLOBIN A1C: 6.1 % — AB (ref <5.7)
Mean Plasma Glucose: 128 mg/dL

## 2015-08-08 LAB — VITAMIN D 25 HYDROXY (VIT D DEFICIENCY, FRACTURES): Vit D, 25-Hydroxy: 29 ng/mL — ABNORMAL LOW (ref 30–100)

## 2015-08-08 LAB — MICROALBUMIN, URINE: MICROALB UR: 0.7 mg/dL

## 2015-08-10 ENCOUNTER — Ambulatory Visit (INDEPENDENT_AMBULATORY_CARE_PROVIDER_SITE_OTHER): Payer: BLUE CROSS/BLUE SHIELD | Admitting: Internal Medicine

## 2015-08-10 ENCOUNTER — Encounter: Payer: Self-pay | Admitting: Internal Medicine

## 2015-08-10 VITALS — BP 118/64 | HR 77 | Temp 98.0°F | Resp 20 | Ht 62.0 in | Wt 167.0 lb

## 2015-08-10 DIAGNOSIS — E669 Obesity, unspecified: Secondary | ICD-10-CM | POA: Diagnosis not present

## 2015-08-10 DIAGNOSIS — E119 Type 2 diabetes mellitus without complications: Secondary | ICD-10-CM | POA: Diagnosis not present

## 2015-08-10 DIAGNOSIS — E559 Vitamin D deficiency, unspecified: Secondary | ICD-10-CM | POA: Diagnosis not present

## 2015-08-10 DIAGNOSIS — S40022A Contusion of left upper arm, initial encounter: Secondary | ICD-10-CM | POA: Diagnosis not present

## 2015-08-10 DIAGNOSIS — Z0000 Encounter for general adult medical examination without abnormal findings: Secondary | ICD-10-CM | POA: Diagnosis not present

## 2015-08-10 DIAGNOSIS — E785 Hyperlipidemia, unspecified: Secondary | ICD-10-CM | POA: Diagnosis not present

## 2015-08-10 DIAGNOSIS — Z9660 Presence of unspecified orthopedic joint implant: Secondary | ICD-10-CM

## 2015-08-10 DIAGNOSIS — I10 Essential (primary) hypertension: Secondary | ICD-10-CM

## 2015-08-10 DIAGNOSIS — E8881 Metabolic syndrome: Secondary | ICD-10-CM

## 2015-08-10 DIAGNOSIS — Z96643 Presence of artificial hip joint, bilateral: Secondary | ICD-10-CM

## 2015-08-10 DIAGNOSIS — J309 Allergic rhinitis, unspecified: Secondary | ICD-10-CM

## 2015-08-10 LAB — POCT URINALYSIS DIPSTICK
BILIRUBIN UA: NEGATIVE
Glucose, UA: NEGATIVE
Ketones, UA: NEGATIVE
Leukocytes, UA: NEGATIVE
NITRITE UA: NEGATIVE
PH UA: 7.5
PROTEIN UA: NEGATIVE
RBC UA: NEGATIVE
Spec Grav, UA: 1.025
Urobilinogen, UA: 0.2

## 2015-08-10 NOTE — Patient Instructions (Addendum)
Continue same meds and return in 6 months. Continue diet exercise and weight loss efforts. Order given for Zostavax vaccine.

## 2015-08-10 NOTE — Progress Notes (Signed)
Subjective:    Patient ID: Carla [name], female    DOB: 06-29-1955, 59 y.o.   MRN: [id number]  HPI  60 year old White Female for health maintenance exam and evaluation of medical issues. Needs Zostavax - order given.She had lab work done on Friday, March 31. Shows me a hematoma left antecubital fossa and says she felt a distinct tingle and pain down left forearm with blood draw. Says still has pain when moving left wrist.  She has a history of diabetes mellitus, hyperlipidemia, obesity, allergic rhinitis, osteoarthritis and history of vitamin D deficiency.  Eye exam done at [facility].  History of allergic rhinitis treated with Singulair and Pulmicort.  Pneumovax immunization September 2010. Tetanus immunization February 2012.  Dr. Nori Riis is GYN physician. History of HSV type II and condyloma acuminata.  History of fractured right wrist 1990. History of partial phalangectomy right second toe 1990. Endometrial polypectomy along with D&C procedure 1998. Arthroscopic debridement of left hip in the spring of 1999 at [facility]. Left hip replacement November 1999. Total abdominal hysterectomy BSO for fibroids and right dermoid cyst in 2007. Right hip replacement 2010.  Had colonoscopy in 2006.  Bone density study 2010.  Social history: Employed as a Geophysicist/field seismologist. This is her second marriage. Current husband has chronic hepatitis and diabetes. He is unable to work and is on disability. No children.  Family history: Father died of complications of Parkinson's disease. Mother in fairly good health. One brother and one sister in good health.  Not getting to exercise very much. Walks the dog some.        Review of Systems  Constitutional: Negative.   Respiratory: Negative.   Cardiovascular: Negative.   Neurological:       Complaining of paresthesia left hand status post blood draw       Objective:   Physical Exam  Constitutional: She is oriented to person,  place, and time. She appears well-developed and well-nourished. No distress.  HENT:  Head: Normocephalic and atraumatic.  Right Ear: External ear normal.  Left Ear: External ear normal.  Mouth/Throat: Oropharynx is clear and moist. No oropharyngeal exudate.  Eyes: Conjunctivae and EOM are normal. Pupils are equal, round, and reactive to light. Right eye exhibits no discharge. Left eye exhibits no discharge. No scleral icterus.  Neck: Neck supple. No JVD present. No thyromegaly present.  Cardiovascular: Normal rate, regular rhythm, normal heart sounds and intact distal pulses.   No murmur heard. Pulmonary/Chest: Effort normal and breath sounds normal. No respiratory distress. She has no wheezes. She has no rales.  Breasts normal female  Abdominal: Soft. Bowel sounds are normal. She exhibits no distension and no mass. There is no tenderness. There is no rebound and no guarding.  Genitourinary:  Deferred status post total abdominal hysterectomy BSO. Has GYN physician.  Musculoskeletal: She exhibits no edema.  Lymphadenopathy:    She has no cervical adenopathy.  Neurological: She is alert and oriented to person, place, and time. She has normal reflexes. No cranial nerve deficit. Coordination normal.  Full range of motion left upper extremity without weakness  Skin: Skin is warm and dry. No rash noted. She is not diaphoretic.  Hematoma left antecubital fossa.  Psychiatric: She has a normal mood and affect. Her behavior is normal. Judgment and thought content normal.  Vitals reviewed.         Assessment & Plan:  Controlled type 2 diabetes mellitus-hemoglobin A1c stable at 6.1%  Allergic rhinitis  Hyperlipidemia-continue statin therapy  Essential hypertension-continue prescription medication  Paresthesia of left upper extremity status post blood draw-patient reassured symptoms are likely to resolve. Mild hematoma left antecubital fossa should resolve  Obesity-continue diet and  exercise efforts  History of vitamin D deficiency-take 2000 units vitamin D 3 daily  History of osteoarthritis status post hip replacement  Plan: Return in 6 months or as needed. Continue same medications. Encouraged diet exercise and weight loss.

## 2015-08-29 ENCOUNTER — Encounter: Payer: Self-pay | Admitting: Internal Medicine

## 2015-09-09 ENCOUNTER — Ambulatory Visit: Payer: BLUE CROSS/BLUE SHIELD | Admitting: Podiatry

## 2015-09-10 ENCOUNTER — Ambulatory Visit (INDEPENDENT_AMBULATORY_CARE_PROVIDER_SITE_OTHER): Payer: BLUE CROSS/BLUE SHIELD

## 2015-09-10 ENCOUNTER — Ambulatory Visit (INDEPENDENT_AMBULATORY_CARE_PROVIDER_SITE_OTHER): Payer: BLUE CROSS/BLUE SHIELD | Admitting: Podiatry

## 2015-09-10 ENCOUNTER — Encounter: Payer: Self-pay | Admitting: Podiatry

## 2015-09-10 VITALS — BP 102/57 | HR 59 | Resp 16 | Ht 62.0 in | Wt 167.0 lb

## 2015-09-10 DIAGNOSIS — M79671 Pain in right foot: Secondary | ICD-10-CM | POA: Diagnosis not present

## 2015-09-10 DIAGNOSIS — M722 Plantar fascial fibromatosis: Secondary | ICD-10-CM

## 2015-09-10 MED ORDER — TRIAMCINOLONE ACETONIDE 10 MG/ML IJ SUSP
10.0000 mg | Freq: Once | INTRAMUSCULAR | Status: AC
  Administered 2015-09-10: 10 mg

## 2015-09-10 NOTE — Patient Instructions (Signed)

## 2015-09-10 NOTE — Progress Notes (Signed)
   Subjective:    Patient ID: Carla [name], female    DOB: 06/05/1955, 60 y.o.   MRN: [id number]  HPI "I've got right heel pain.  It doesn't hurt pushing on it.  It's when I stand on it.  It feels like someone is putting a knife through it.  I've tried massaging it, inserts, new tennis shoes.  I haven't taken any medicine.  It pretty much hurts at work standing all day long.  It's a little bothersome when I flex.  I take my shoe off at work and go barefoot, that seems to help.  It has gotten worse over time."      Review of Systems  Constitutional:       Sinus problems  Respiratory: Positive for cough.   Musculoskeletal: Positive for back pain.       Muscle pains  Allergic/Immunologic: Positive for environmental allergies.  All other systems reviewed and are negative.      Objective:   Physical Exam        Assessment & Plan:

## 2015-09-11 NOTE — Progress Notes (Signed)
Subjective:     Patient ID: Carla [name], female   DOB: 10-10-1955, 60 y.o.   MRN: [id number]  HPI patient states that she has a lot of pain in the right plantar heel and it feels like a nice this going through it. Does not have current back shooting pain but does have moderate back structural pain over time. States it's been present for several months   Review of Systems  All other systems reviewed and are negative.      Objective:   Physical Exam  Constitutional: She is oriented to person, place, and time.  Cardiovascular: Intact distal pulses.   Musculoskeletal: Normal range of motion.  Neurological: She is oriented to person, place, and time.  Skin: Skin is warm.  Nursing note and vitals reviewed.  neurovascular status intact muscle strength adequate range of motion within normal limits with patient found to have area of irritation plantar heel. It was difficult to palpate it but it appears to be within the plantar fascia itself when pointed to and there is fluid in this area when palpated. Found to have good digital perfusion and is well oriented 3     Assessment:     Probable plantar fasciitis right with possibility for some kind of a discrete compression in the back    Plan:     H&P and x-rays reviewed. Today I did a plantar injection 3 mg Kenalog 5 mg Xylocaine and applied fascial brace with instructions on usage and placed on diclofenac 75 mg twice a day. Reappoint in 2 weeks to reevaluate  X-ray report indicates that there is spur formation with no indications of stress fracture or arthritis

## 2015-09-24 ENCOUNTER — Encounter: Payer: Self-pay | Admitting: Podiatry

## 2015-09-24 ENCOUNTER — Ambulatory Visit (INDEPENDENT_AMBULATORY_CARE_PROVIDER_SITE_OTHER): Payer: BLUE CROSS/BLUE SHIELD | Admitting: Podiatry

## 2015-09-24 DIAGNOSIS — M722 Plantar fascial fibromatosis: Secondary | ICD-10-CM

## 2015-09-24 NOTE — Progress Notes (Signed)
Subjective:     Patient ID: Carla [name], female   DOB: May 01, 1956, 60 y.o.   MRN: [id number]  HPI patient states I'm doing okay with pain still noted in my heel   Review of Systems     Objective:   Physical Exam Neurovascular status intact muscle strength adequate with discomfort still noted in the right plantar heel insertional point tendon calcaneus    Assessment:     Plantar fasciitis improved but still present right    Plan:     Discussed physical therapy and long-term orthotics but she's got a hold off on making him currently. I went ahead today and will reappoint this patient again in the next 4 weeks

## 2015-10-12 LAB — HM DIABETES EYE EXAM

## 2015-11-04 ENCOUNTER — Other Ambulatory Visit: Payer: Self-pay | Admitting: Internal Medicine

## 2015-11-13 ENCOUNTER — Other Ambulatory Visit: Payer: Self-pay | Admitting: Internal Medicine

## 2015-12-17 ENCOUNTER — Other Ambulatory Visit: Payer: Self-pay | Admitting: Internal Medicine

## 2016-01-05 ENCOUNTER — Telehealth: Payer: Self-pay

## 2016-01-05 NOTE — Telephone Encounter (Signed)
Agree pt should go to ED.
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
## 2016-01-23 ENCOUNTER — Telehealth: Payer: Self-pay | Admitting: Internal Medicine

## 2016-01-23 ENCOUNTER — Encounter: Payer: Self-pay | Admitting: Internal Medicine

## 2016-01-23 MED ORDER — ALPRAZOLAM 0.5 MG PO TABS: ORAL_TABLET | ORAL | 5 refills | Status: DC

## 2016-01-23 NOTE — Telephone Encounter (Signed)
RF Xanax x 6 months

## 2016-01-25 ENCOUNTER — Telehealth: Payer: Self-pay | Admitting: *Deleted

## 2016-01-25 NOTE — Telephone Encounter (Signed)
Called in Rx refill per Dr. Renold Genta: Alprazolam 0.5mg  tab Quantity: 60 Directions: Take 1/2 tab to 1 tab by mouth twice daily prn for anxiety. Refill: 5

## 2016-02-05 ENCOUNTER — Other Ambulatory Visit: Payer: BLUE CROSS/BLUE SHIELD | Admitting: Internal Medicine

## 2016-02-08 ENCOUNTER — Ambulatory Visit: Payer: BLUE CROSS/BLUE SHIELD | Admitting: Internal Medicine

## 2016-02-12 ENCOUNTER — Other Ambulatory Visit (INDEPENDENT_AMBULATORY_CARE_PROVIDER_SITE_OTHER): Payer: BLUE CROSS/BLUE SHIELD | Admitting: Internal Medicine

## 2016-02-12 DIAGNOSIS — E138 Other specified diabetes mellitus with unspecified complications: Secondary | ICD-10-CM

## 2016-02-12 DIAGNOSIS — E785 Hyperlipidemia, unspecified: Secondary | ICD-10-CM

## 2016-02-12 LAB — LIPID PANEL
Cholesterol: 214 mg/dL — ABNORMAL HIGH (ref 125–200)
HDL: 54 mg/dL (ref >=46)
LDL CALC: 121 mg/dL (ref <130)
TRIGLYCERIDES: 194 mg/dL — AB (ref <150)
Total CHOL/HDL Ratio: 4 Ratio (ref <=5.0)
VLDL: 39 mg/dL — AB (ref <30)

## 2016-02-12 LAB — HEPATIC FUNCTION PANEL
ALBUMIN: 3.9 g/dL (ref 3.6–5.1)
ALK PHOS: 91 U/L (ref 33–130)
ALT: 21 U/L (ref 6–29)
AST: 21 U/L (ref 10–35)
BILIRUBIN DIRECT: 0.1 mg/dL (ref <=0.2)
Indirect Bilirubin: 0.3 mg/dL (ref 0.2–1.2)
Total Bilirubin: 0.4 mg/dL (ref 0.2–1.2)
Total Protein: 6.5 g/dL (ref 6.1–8.1)

## 2016-02-12 LAB — HEMOGLOBIN A1C
Hgb A1c MFr Bld: 5.8 % — ABNORMAL HIGH (ref <5.7)
Mean Plasma Glucose: 120 mg/dL

## 2016-02-15 ENCOUNTER — Encounter: Payer: Self-pay | Admitting: Internal Medicine

## 2016-02-15 ENCOUNTER — Ambulatory Visit (INDEPENDENT_AMBULATORY_CARE_PROVIDER_SITE_OTHER): Payer: BLUE CROSS/BLUE SHIELD | Admitting: Internal Medicine

## 2016-02-15 VITALS — BP 136/84 | HR 70 | Temp 99.0°F | Wt 160.0 lb

## 2016-02-15 DIAGNOSIS — Z23 Encounter for immunization: Secondary | ICD-10-CM

## 2016-02-15 DIAGNOSIS — F439 Reaction to severe stress, unspecified: Secondary | ICD-10-CM | POA: Diagnosis not present

## 2016-02-15 DIAGNOSIS — E782 Mixed hyperlipidemia: Secondary | ICD-10-CM

## 2016-02-15 DIAGNOSIS — E8881 Metabolic syndrome: Secondary | ICD-10-CM

## 2016-02-15 DIAGNOSIS — E119 Type 2 diabetes mellitus without complications: Secondary | ICD-10-CM

## 2016-02-15 DIAGNOSIS — K580 Irritable bowel syndrome with diarrhea: Secondary | ICD-10-CM

## 2016-02-15 MED ORDER — DICYCLOMINE HCL 20 MG PO TABS: 20.0000 mg | ORAL_TABLET | Freq: Three times a day (TID) | ORAL | 0 refills | Status: DC

## 2016-02-15 NOTE — Progress Notes (Signed)
   Subjective:    Patient ID: Carla [name], female    DOB: 18-Feb-1956, 60 y.o.   MRN: [id number]  HPI  Has been on Next 56 days diet for about 9 months. Has lost 7 pounds since April. Diet has a lot of fiber. Yogurt seems to be causing diarrhea. Hgb AIC is 5.8%.  Has been under a lot of stress. Husband is chronically ill.Going to family wedding in [place]. Financial pressure. Having episodes of diarrhea. At one point had episode of incontinence going to work.  History of diabetes mellitus, obesity, and hyperlipidemia.  History of vitamin D deficiency, allergic rhinitis, osteoarthritis    Review of Systems situational stress     Objective:   Physical Exam  Constitutional: She appears well-developed and well-nourished.  Neck: Neck supple. No JVD present. No thyromegaly present.  Cardiovascular: Normal rate, regular rhythm and normal heart sounds.   No murmur heard. Pulmonary/Chest: Effort normal and breath sounds normal. No respiratory distress. She has no wheezes. She has no rales.  Musculoskeletal: She exhibits no edema.  Lymphadenopathy:    She has no cervical adenopathy.  Skin: Skin is warm and dry.  Psychiatric: She has a normal mood and affect. Her behavior is normal. Judgment and thought content normal.  Vitals reviewed.         Assessment & Plan:  Irritable bowel syndrome  Controlled type 2 diabetes  Anxiety depression  Obesity  Hyperlipidemia  Situational stress  Does not have hypertension-maintained on low-dose ACE inhibitor because of diabetes.  Hemoglobin A1c improved from 6.1% 7 months ago to 5.8%.  Total cholesterol 214, triglycerides 194, LDL cholesterol 121. Triglycerides have increased from 114-194. Total cholesterol has increased from 184-214 despite being on this new diet. Liver panel is normal.  Plan: She's going to try Bentyl for irritable bowel symptoms and continue to watch her diet. Return in 6 months or as needed.

## 2016-03-06 NOTE — Patient Instructions (Signed)
Trial of Bentyl for diarrhea symptoms. Continue same medications. Continue to work on diet and exercise and return in 6 months.

## 2016-03-10 ENCOUNTER — Ambulatory Visit (INDEPENDENT_AMBULATORY_CARE_PROVIDER_SITE_OTHER): Payer: BLUE CROSS/BLUE SHIELD | Admitting: Internal Medicine

## 2016-03-10 ENCOUNTER — Encounter: Payer: Self-pay | Admitting: Internal Medicine

## 2016-03-10 VITALS — BP 120/68 | HR 81 | Temp 98.8°F | Wt 163.0 lb

## 2016-03-10 DIAGNOSIS — J069 Acute upper respiratory infection, unspecified: Secondary | ICD-10-CM

## 2016-03-10 MED ORDER — AZITHROMYCIN 250 MG PO TABS
ORAL_TABLET | ORAL | 1 refills | Status: DC
Start: 2016-03-10 — End: 2016-09-12

## 2016-03-10 MED ORDER — HYDROCODONE-HOMATROPINE 5-1.5 MG/5ML PO SYRP: 5.0000 mL | ORAL_SOLUTION | Freq: Three times a day (TID) | ORAL | 0 refills | Status: DC | PRN

## 2016-03-10 MED ORDER — BENZONATATE 100 MG PO CAPS: 100.0000 mg | ORAL_CAPSULE | Freq: Three times a day (TID) | ORAL | 1 refills | Status: DC

## 2016-03-10 NOTE — Progress Notes (Signed)
   Subjective:    Patient ID: Carla [name], female    DOB: [birth year], 60 y.o.   MRN: [id number]  HPI Onset URI symptoms after attending wedding recently in [place]. Was seen here October 9 for follow-up on diabetes and hypertension. Was doing well at that time. She had a good time in [place] but weather was cold. Several people became ill.    Review of Systems cough and congestion. Fatigue and malaise.     Objective:   Physical Exam Skin warm and dry. Nodes none. Pharynx slightly injected. TMs slightly full. Neck is supple. Chest clear to auscultation       Assessment & Plan:  Acute URI  Plan: Zithromax Z-Pak take 2 tablets day one followed by 1 tablet days 2 through 5 with 1 refill if not better in 7-10 days get prescription refilled. Tessalon Perles 100 mg 3 times daily as needed for cough. Hycodan 1 teaspoon by mouth every 8 hours when necessary cough. Continue Singulair.

## 2016-03-24 ENCOUNTER — Other Ambulatory Visit: Payer: Self-pay | Admitting: Internal Medicine

## 2016-04-02 NOTE — Patient Instructions (Signed)
Take Zithromax as prescribed. If not better in 7-10 days have it refilled. Hycodan as needed for cough at night. Tessalon Perles as needed for cough during daytime.

## 2016-04-04 ENCOUNTER — Telehealth: Payer: Self-pay | Admitting: Internal Medicine

## 2016-04-04 NOTE — Telephone Encounter (Signed)
She has had 2 z-paks now and has been taking Tessalon Perles and had cough syrup as well.  States that she is feeling much better, but she is still coughing.  Wants to know if you think she needs to be seen again or if she just needs something for the cough?    She takes Zyrtec in the morning and a Singulair at night.    Pharmacy:  [facility] # for contact:  [phone]

## 2016-04-04 NOTE — Telephone Encounter (Signed)
Please book OV

## 2016-04-04 NOTE — Telephone Encounter (Signed)
Spoke with patient and offered appointment for Monday afternoon, Tuesday and patient opted for Thursday appointment.

## 2016-04-07 ENCOUNTER — Ambulatory Visit (INDEPENDENT_AMBULATORY_CARE_PROVIDER_SITE_OTHER): Payer: BLUE CROSS/BLUE SHIELD | Admitting: Internal Medicine

## 2016-04-07 ENCOUNTER — Encounter: Payer: Self-pay | Admitting: Internal Medicine

## 2016-04-07 VITALS — BP 128/70 | HR 73 | Temp 99.0°F | Ht 62.0 in | Wt 166.0 lb

## 2016-04-07 DIAGNOSIS — R05 Cough: Secondary | ICD-10-CM

## 2016-04-07 DIAGNOSIS — J069 Acute upper respiratory infection, unspecified: Secondary | ICD-10-CM | POA: Diagnosis not present

## 2016-04-07 DIAGNOSIS — R059 Cough, unspecified: Secondary | ICD-10-CM

## 2016-04-07 MED ORDER — BENZONATATE 100 MG PO CAPS: 100.0000 mg | ORAL_CAPSULE | Freq: Three times a day (TID) | ORAL | 1 refills | Status: DC

## 2016-04-07 MED ORDER — GLYCOPYRROLATE-FORMOTEROL 9-4.8 MCG/ACT IN AERO: 9.0000 ug | INHALATION_SPRAY | RESPIRATORY_TRACT | 0 refills | Status: DC

## 2016-04-07 MED ORDER — PREDNISONE 10 MG PO TABS
ORAL_TABLET | ORAL | 0 refills | Status: DC
Start: 2016-04-07 — End: 2016-09-12

## 2016-04-07 MED ORDER — HYDROCODONE-HOMATROPINE 5-1.5 MG/5ML PO SYRP: 5.0000 mL | ORAL_SOLUTION | Freq: Three times a day (TID) | ORAL | 0 refills | Status: DC | PRN

## 2016-04-07 NOTE — Patient Instructions (Addendum)
Sterapred DS 10 mg 6 day dosepak. Refill Hycodan. Refill Tessalon Perles. Antibiotics have not been prescribed. Inhaler sample provided.

## 2016-04-07 NOTE — Progress Notes (Signed)
   Subjective:    Patient ID: Carla [name], female    DOB: 05/25/1955, 60 y.o.   MRN: [id number]  HPI  Onset Wednesday before Thanksgiving with sore throat and ear congestion. No fever ot chills. No coughing at night.  Was given Z-pak on Novemebr 2.Took refill on Zithromax Z-Pak before going out of town to [place] for the Thanksgiving holiday. Out of Hycodan and Tessalon perles.   Review of Systems has postnasal drip and lots of coughing at night. Does not appear to have discolored sputum or nasal drainage.     Objective:   Physical Exam Skin warm and dry. No stone. TMs are slightly full bilaterally. Pharynx slightly injected. Has anterior cervical nodes bilaterally. Chest clear to auscultation.       Assessment & Plan:  Protracted URI Hx Allergic rhinitis  Plan: Sterapred DS 10 mg 6 day dosepack. Refill Hycodan, Refill Tessalon perles. Antibiotics have not been represcribed. I think this is more allergy related. Sample of Bevespi inhaler.

## 2016-05-21 ENCOUNTER — Other Ambulatory Visit: Payer: Self-pay | Admitting: Internal Medicine

## 2016-05-31 ENCOUNTER — Other Ambulatory Visit: Payer: Self-pay | Admitting: Internal Medicine

## 2016-06-01 NOTE — Telephone Encounter (Signed)
Refill x 6 months 

## 2016-08-12 ENCOUNTER — Other Ambulatory Visit: Payer: BLUE CROSS/BLUE SHIELD | Admitting: Internal Medicine

## 2016-08-15 ENCOUNTER — Encounter: Payer: BLUE CROSS/BLUE SHIELD | Admitting: Internal Medicine

## 2016-08-30 ENCOUNTER — Other Ambulatory Visit: Payer: Self-pay | Admitting: Internal Medicine

## 2016-09-12 ENCOUNTER — Ambulatory Visit (INDEPENDENT_AMBULATORY_CARE_PROVIDER_SITE_OTHER): Payer: BLUE CROSS/BLUE SHIELD

## 2016-09-12 ENCOUNTER — Encounter: Payer: Self-pay | Admitting: Podiatry

## 2016-09-12 ENCOUNTER — Ambulatory Visit (INDEPENDENT_AMBULATORY_CARE_PROVIDER_SITE_OTHER): Payer: BLUE CROSS/BLUE SHIELD | Admitting: Podiatry

## 2016-09-12 DIAGNOSIS — M722 Plantar fascial fibromatosis: Secondary | ICD-10-CM | POA: Diagnosis not present

## 2016-09-12 MED ORDER — TRIAMCINOLONE ACETONIDE 10 MG/ML IJ SUSP
10.0000 mg | Freq: Once | INTRAMUSCULAR | Status: AC
  Administered 2016-09-12: 10 mg

## 2016-09-14 NOTE — Progress Notes (Signed)
Subjective:    Patient ID: Carla [name], female   DOB: 61 y.o.   MRN: [id number]   HPI patient presents stating that her heels have improved but are still painful with the right heel still being quite sore. Patient states that it's worsened recently  ROS      Objective:  Physical Exam Neurovascular status unchanged with patient found have exquisite discomfort plantar heel with inflammation fluid buildup around the medial band. There is also moderate depression of the arch    Assessment:     Plantar fasciitis right over left with inflammation fluid buildup and also depression of the arch    Plan:   H&P condition reviewed and at this time I reinjected the plantar fascia 3 mg Kenalog 5 mg Xylocaine and advised on orthotics and scanned for customized orthotics to reduce pressure against the arch and heel

## 2016-09-30 ENCOUNTER — Other Ambulatory Visit: Payer: Self-pay | Admitting: Internal Medicine

## 2016-09-30 ENCOUNTER — Other Ambulatory Visit: Payer: BLUE CROSS/BLUE SHIELD

## 2016-10-04 ENCOUNTER — Other Ambulatory Visit: Payer: BLUE CROSS/BLUE SHIELD

## 2016-10-17 ENCOUNTER — Other Ambulatory Visit: Payer: BLUE CROSS/BLUE SHIELD | Admitting: Internal Medicine

## 2016-10-17 ENCOUNTER — Ambulatory Visit: Payer: BLUE CROSS/BLUE SHIELD | Admitting: Orthotics

## 2016-10-17 DIAGNOSIS — E669 Obesity, unspecified: Secondary | ICD-10-CM

## 2016-10-17 DIAGNOSIS — I10 Essential (primary) hypertension: Secondary | ICD-10-CM

## 2016-10-17 DIAGNOSIS — Z1329 Encounter for screening for other suspected endocrine disorder: Secondary | ICD-10-CM

## 2016-10-17 DIAGNOSIS — E8881 Metabolic syndrome: Secondary | ICD-10-CM

## 2016-10-17 DIAGNOSIS — E559 Vitamin D deficiency, unspecified: Secondary | ICD-10-CM

## 2016-10-17 DIAGNOSIS — Z0000 Encounter for general adult medical examination without abnormal findings: Secondary | ICD-10-CM

## 2016-10-17 DIAGNOSIS — E138 Other specified diabetes mellitus with unspecified complications: Secondary | ICD-10-CM

## 2016-10-17 DIAGNOSIS — E785 Hyperlipidemia, unspecified: Secondary | ICD-10-CM

## 2016-10-17 LAB — CBC WITH DIFFERENTIAL/PLATELET
BASOS PCT: 1 %
Basophils Absolute: 63 cells/uL (ref 0–200)
EOS ABS: 189 {cells}/uL (ref 15–500)
Eosinophils Relative: 3 %
HEMATOCRIT: 38.8 % (ref 35.0–45.0)
Hemoglobin: 12.5 g/dL (ref 11.7–15.5)
LYMPHS ABS: 2457 {cells}/uL (ref 850–3900)
LYMPHS PCT: 39 %
MCH: 30 pg (ref 27.0–33.0)
MCHC: 32.2 g/dL (ref 32.0–36.0)
MCV: 93 fL (ref 80.0–100.0)
MONO ABS: 567 {cells}/uL (ref 200–950)
MPV: 9.7 fL (ref 7.5–12.5)
Monocytes Relative: 9 %
NEUTROS ABS: 3024 {cells}/uL (ref 1500–7800)
Neutrophils Relative %: 48 %
Platelets: 285 10*3/uL (ref 140–400)
RBC: 4.17 MIL/uL (ref 3.80–5.10)
RDW: 13.6 % (ref 11.0–15.0)
WBC: 6.3 10*3/uL (ref 3.8–10.8)

## 2016-10-17 LAB — LIPID PANEL
CHOL/HDL RATIO: 2.7 ratio (ref <5.0)
Cholesterol: 169 mg/dL (ref <200)
HDL: 62 mg/dL (ref >50)
LDL CALC: 77 mg/dL (ref <100)
Triglycerides: 150 mg/dL — ABNORMAL HIGH (ref <150)
VLDL: 30 mg/dL (ref <30)

## 2016-10-17 LAB — TSH: TSH: 1.72 mIU/L

## 2016-10-17 LAB — COMPLETE METABOLIC PANEL WITH GFR
ALT: 15 U/L (ref 6–29)
AST: 18 U/L (ref 10–35)
Albumin: 3.8 g/dL (ref 3.6–5.1)
Alkaline Phosphatase: 74 U/L (ref 33–130)
BILIRUBIN TOTAL: 0.6 mg/dL (ref 0.2–1.2)
BUN: 16 mg/dL (ref 7–25)
CALCIUM: 9.3 mg/dL (ref 8.6–10.4)
CHLORIDE: 103 mmol/L (ref 98–110)
CO2: 29 mmol/L (ref 20–31)
CREATININE: 0.75 mg/dL (ref 0.50–0.99)
GFR, Est African American: >89 mL/min (ref >=60)
GFR, Est Non African American: 85 mL/min (ref >=60)
Glucose, Bld: 100 mg/dL — ABNORMAL HIGH (ref 65–99)
Potassium: 4.6 mmol/L (ref 3.5–5.3)
Sodium: 141 mmol/L (ref 135–146)
TOTAL PROTEIN: 6.7 g/dL (ref 6.1–8.1)

## 2016-10-17 LAB — MICROALBUMIN / CREATININE URINE RATIO
CREATININE, URINE: 137 mg/dL (ref 20–320)
MICROALB UR: 0.4 mg/dL
Microalb Creat Ratio: 3 mcg/mg creat (ref <30)

## 2016-10-18 ENCOUNTER — Other Ambulatory Visit: Payer: BLUE CROSS/BLUE SHIELD | Admitting: Internal Medicine

## 2016-10-18 LAB — VITAMIN D 25 HYDROXY (VIT D DEFICIENCY, FRACTURES): Vit D, 25-Hydroxy: 27 ng/mL — ABNORMAL LOW (ref 30–100)

## 2016-10-18 LAB — HEMOGLOBIN A1C
Hgb A1c MFr Bld: 5.9 % — ABNORMAL HIGH (ref <5.7)
Mean Plasma Glucose: 123 mg/dL

## 2016-10-20 ENCOUNTER — Ambulatory Visit (INDEPENDENT_AMBULATORY_CARE_PROVIDER_SITE_OTHER): Payer: BLUE CROSS/BLUE SHIELD | Admitting: Internal Medicine

## 2016-10-20 ENCOUNTER — Encounter: Payer: Self-pay | Admitting: Internal Medicine

## 2016-10-20 VITALS — BP 106/58 | HR 73 | Temp 98.7°F | Ht 61.25 in | Wt 178.0 lb

## 2016-10-20 DIAGNOSIS — Z0000 Encounter for general adult medical examination without abnormal findings: Secondary | ICD-10-CM | POA: Diagnosis not present

## 2016-10-20 DIAGNOSIS — E782 Mixed hyperlipidemia: Secondary | ICD-10-CM | POA: Diagnosis not present

## 2016-10-20 DIAGNOSIS — Z1231 Encounter for screening mammogram for malignant neoplasm of breast: Secondary | ICD-10-CM

## 2016-10-20 DIAGNOSIS — Z13820 Encounter for screening for osteoporosis: Secondary | ICD-10-CM

## 2016-10-20 DIAGNOSIS — M1990 Unspecified osteoarthritis, unspecified site: Secondary | ICD-10-CM | POA: Diagnosis not present

## 2016-10-20 DIAGNOSIS — F439 Reaction to severe stress, unspecified: Secondary | ICD-10-CM | POA: Diagnosis not present

## 2016-10-20 DIAGNOSIS — Z1239 Encounter for other screening for malignant neoplasm of breast: Secondary | ICD-10-CM

## 2016-10-20 DIAGNOSIS — E119 Type 2 diabetes mellitus without complications: Secondary | ICD-10-CM | POA: Diagnosis not present

## 2016-10-20 DIAGNOSIS — Z6833 Body mass index (BMI) 33.0-33.9, adult: Secondary | ICD-10-CM | POA: Diagnosis not present

## 2016-10-20 DIAGNOSIS — Z23 Encounter for immunization: Secondary | ICD-10-CM | POA: Diagnosis not present

## 2016-10-20 DIAGNOSIS — E8881 Metabolic syndrome: Secondary | ICD-10-CM

## 2016-10-20 LAB — POCT URINALYSIS DIPSTICK
Bilirubin, UA: NEGATIVE
Blood, UA: NEGATIVE
Glucose, UA: NEGATIVE
KETONES UA: NEGATIVE
Leukocytes, UA: NEGATIVE
Nitrite, UA: NEGATIVE
PH UA: 6.5 (ref 5.0–8.0)
PROTEIN UA: NEGATIVE
SPEC GRAV UA: 1.025 (ref 1.010–1.025)
Urobilinogen, UA: 0.2 E.U./dL

## 2016-10-25 ENCOUNTER — Ambulatory Visit: Payer: BLUE CROSS/BLUE SHIELD | Admitting: Orthotics

## 2016-10-31 ENCOUNTER — Other Ambulatory Visit: Payer: Self-pay | Admitting: Internal Medicine

## 2016-10-31 DIAGNOSIS — M8580 Other specified disorders of bone density and structure, unspecified site: Secondary | ICD-10-CM

## 2016-11-03 ENCOUNTER — Ambulatory Visit: Payer: BLUE CROSS/BLUE SHIELD

## 2016-11-05 NOTE — Progress Notes (Signed)
   Subjective:    Patient ID: Carla [name], female    DOB: 16-Dec-1955, 61 y.o.   MRN: [id number]  HPI 61 year old White Female in today for health maintenance exam and evaluation of medical issues. She has a history of diabetes mellitus, hyperlipidemia, obesity, allergic rhinitis, osteoarthritis and history of vitamin D deficiency. She does not have hypertension, has been maintained on ACE inhibitor due to diabetes.  Her husband was recently hospitalized with chest pain. He has many medical issues and it is stressful for her. He is disabled and does not work.  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  She doesn't have time to exercise.  Allergic rhinitis treated with Singulair and Pulmicort.  Dr. Milta Deiters is GYN physician. History of HSV type II and condyloma acuminata  History of fractured right wrist 1990. History of partial phalangectomy right second toe 1990. Endometrial polypectomy with D&C 1998. Arthroscopic debridement of left hip in the spring of 1999. Left hip replacement November 1999. Total abdominal hysterectomy BSO for fibroids and right dermoid cyst in 2007. Right hip replacement 2010.  Had colonoscopy in 2013.  Bone density study 2010  Social history: She does not smoke. Social alcohol consumption. No children.  Family history: Father died of complications of Parkinson's disease. Mother in fairly good health. One brother and one sister in good health.    Review of Systems fatigue and mild depression secondary to situational stress with husband. Wants me to speak with husband about poor diabetic control.     Objective:   Physical Exam  Constitutional: She is oriented to person, place, and time. She appears well-developed and well-nourished. No distress.  HENT:  Head: Normocephalic and atraumatic.  Right Ear: External ear normal.  Left Ear: External ear normal.  Mouth/Throat: Oropharynx is clear and moist.  Eyes: Conjunctivae are normal. Pupils are equal,  round, and reactive to light.  Neck: Neck supple. No JVD present. No thyromegaly present.  Cardiovascular: Normal rate, normal heart sounds and intact distal pulses.   No murmur heard. Pulmonary/Chest: Breath sounds normal. No respiratory distress. She has no wheezes.  Breasts normal female  Abdominal: Soft. Bowel sounds are normal. She exhibits no distension and no mass. There is no tenderness. There is no rebound and no guarding.  Genitourinary:  Genitourinary Comments: Deferred status post TAH/BSO  Musculoskeletal: She exhibits no edema.  Neurological: She is alert and oriented to person, place, and time. She has normal reflexes.  Skin: Skin is warm and dry. She is not diaphoretic.  Psychiatric: Her behavior is normal. Judgment and thought content normal.  Vitals reviewed.         Assessment & Plan:  Vitamin D deficiency-take 2000 units vitamin D 3 daily  Type 2 diabetes mellitus-hemoglobin A1c stable at 5.9% continue low-dose ACE inhibitor for diabetes. She has not hypertensive.  Hyperlipidemia-8 months ago, triglycerides were 194  and now 150. Total cholesterol 169 and LDL cholesterol 77  BMI 33.36-continue diet and exercise efforts.  Situational stress with husband  Osteoarthritis  Allergic rhinitis-stable  Health maintenance-needs mammogram-order placed  Return in 6 months.

## 2016-11-05 NOTE — Patient Instructions (Addendum)
It was a pleasure to see you today. Continue same medications and return in 6 months. Vaccine given.

## 2016-11-22 ENCOUNTER — Ambulatory Visit
Admission: RE | Admit: 2016-11-22 | Discharge: 2016-11-22 | Disposition: A | Discharge status: Home / Self Care | Payer: BLUE CROSS/BLUE SHIELD | Source: Ambulatory Visit | Attending: Internal Medicine | Admitting: Internal Medicine

## 2016-11-22 DIAGNOSIS — Z1239 Encounter for other screening for malignant neoplasm of breast: Secondary | ICD-10-CM

## 2016-11-22 DIAGNOSIS — M8580 Other specified disorders of bone density and structure, unspecified site: Secondary | ICD-10-CM

## 2016-11-29 ENCOUNTER — Encounter: Payer: Self-pay | Admitting: Internal Medicine

## 2016-11-29 LAB — HM DIABETES EYE EXAM

## 2016-12-05 IMAGING — CR DG KNEE COMPLETE 4+V*L*
4 series · 4 of 4 positions shown · non-contrast
Comparison: None.

CLINICAL DATA: Acute left knee pain after slipping coming out of
work today. Initial encounter.

EXAM:
LEFT KNEE - COMPLETE 4+ VIEW

[t knee ap left]
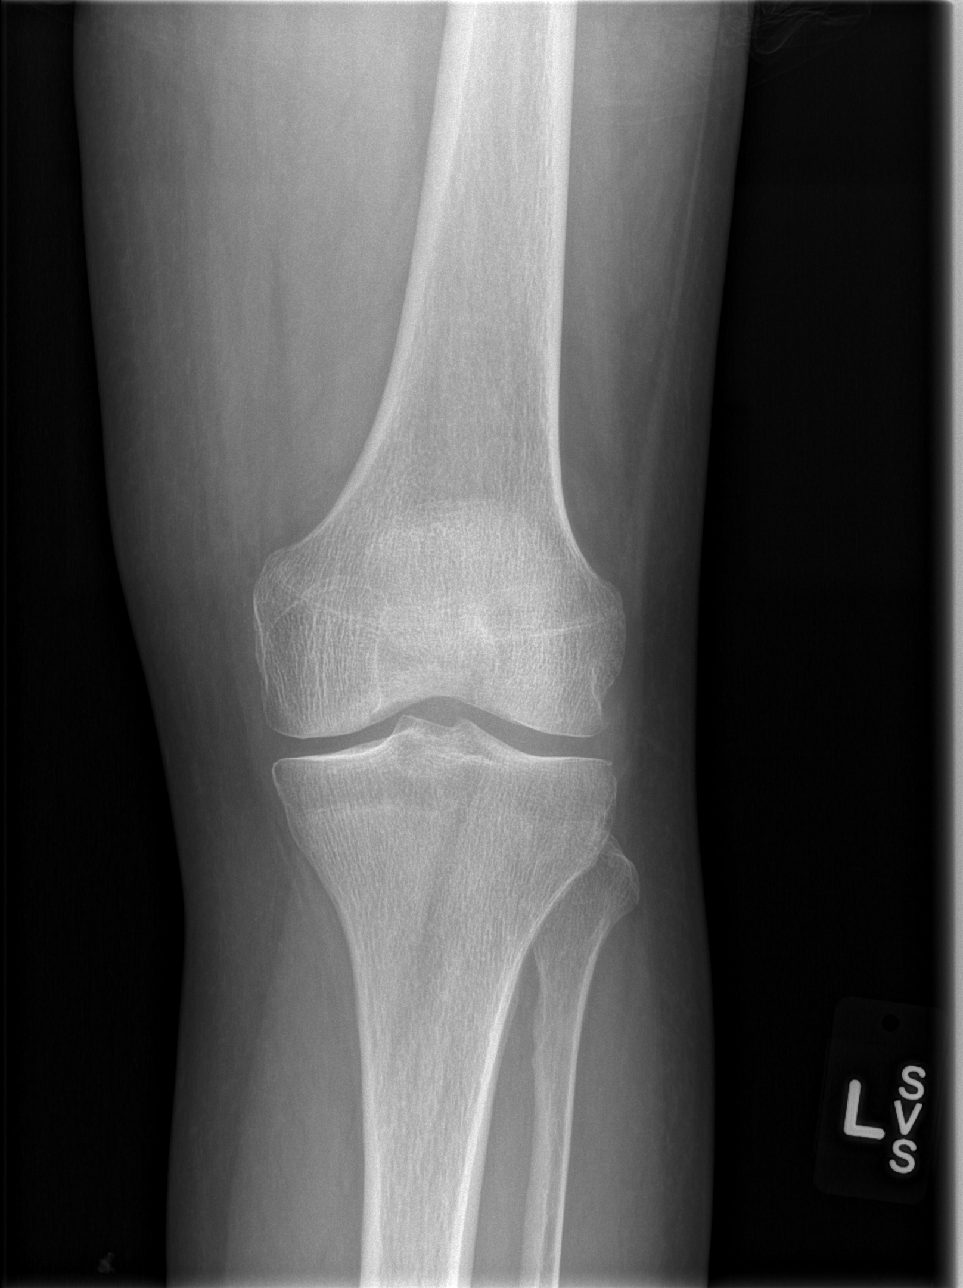

[t knee obl left (1 of 2)]
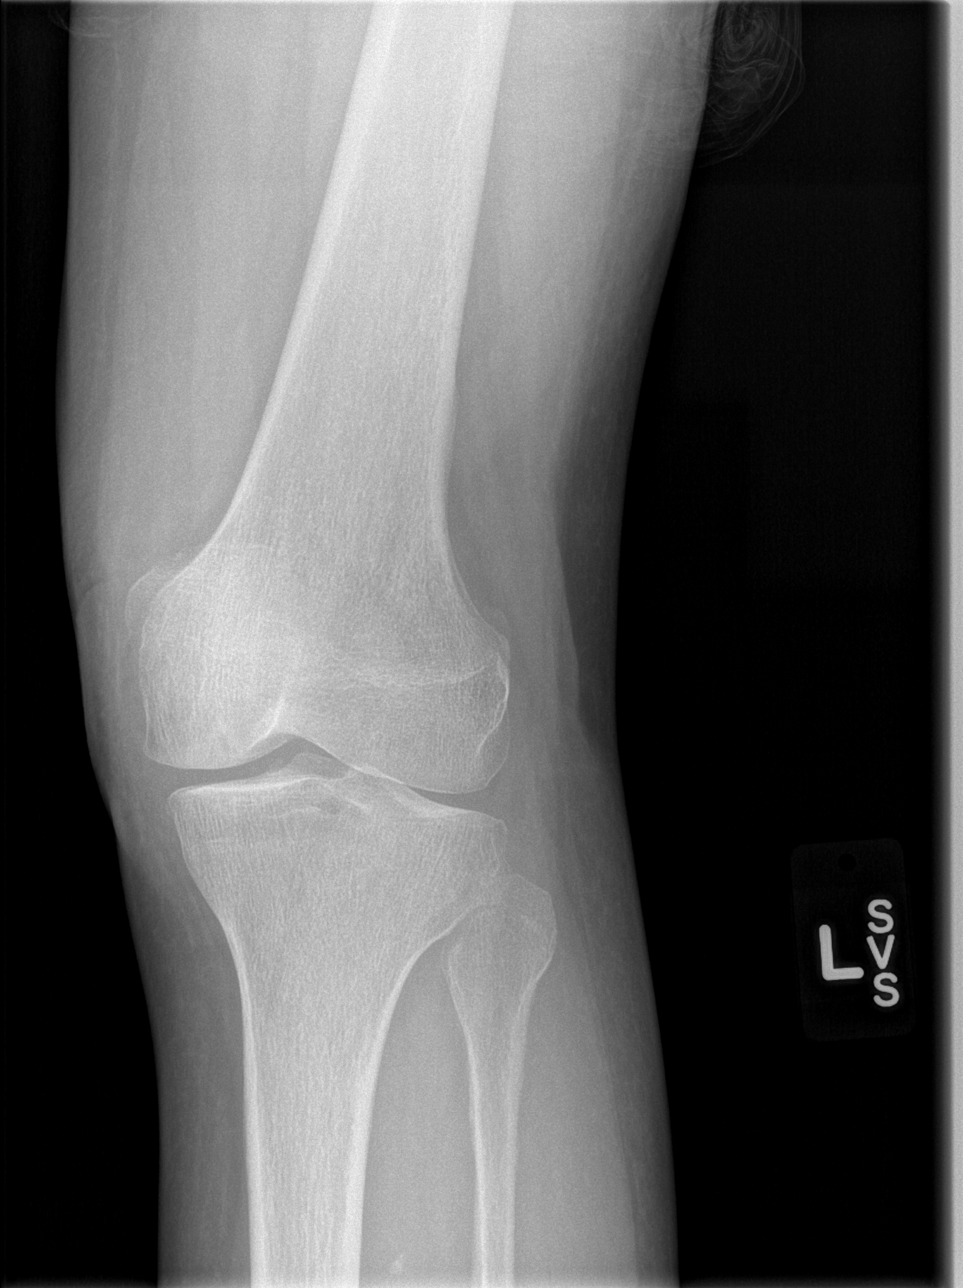

[t knee obl left (2 of 2)]
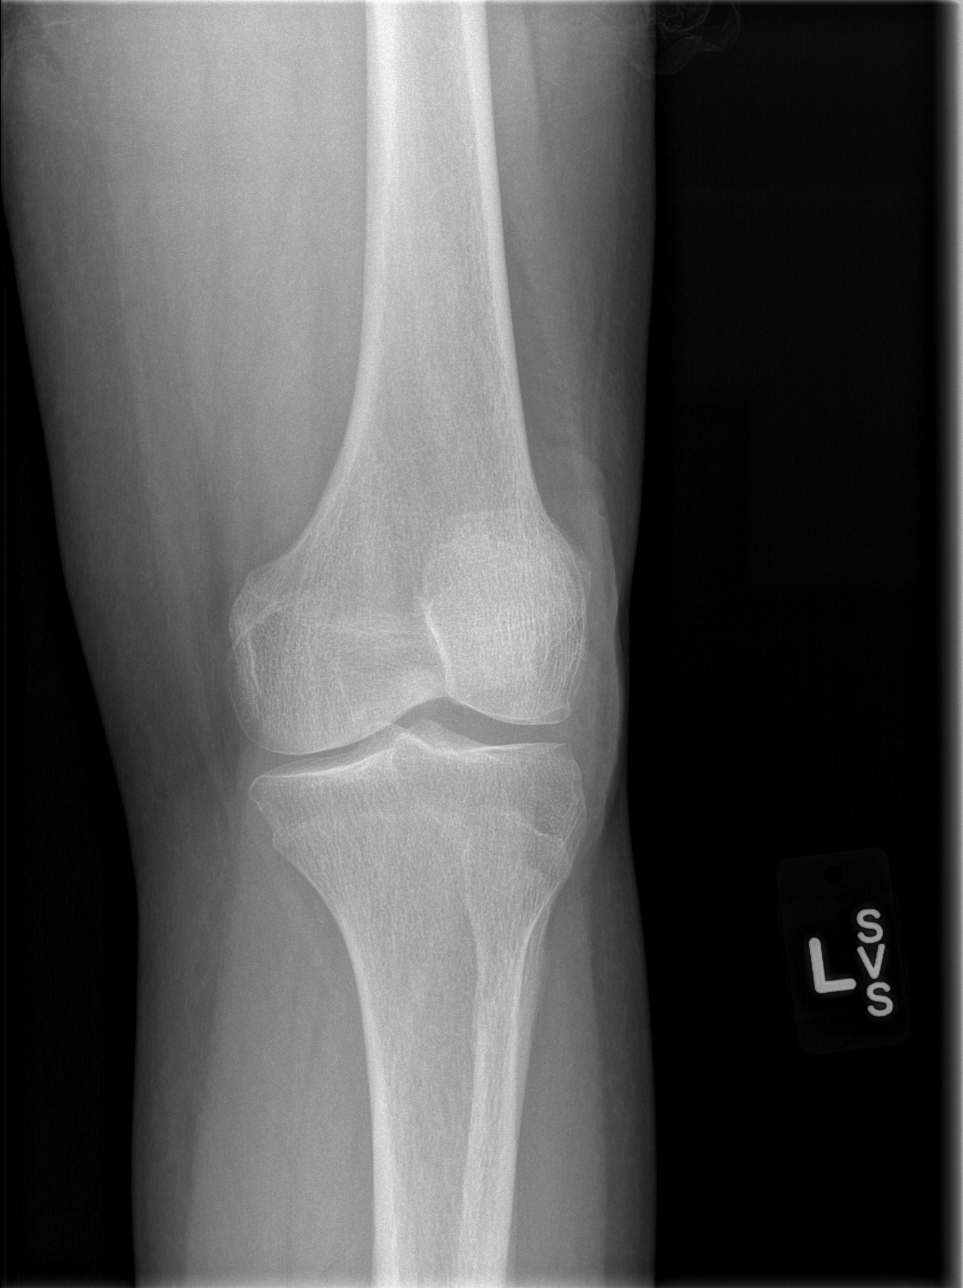

[t knee lat left]
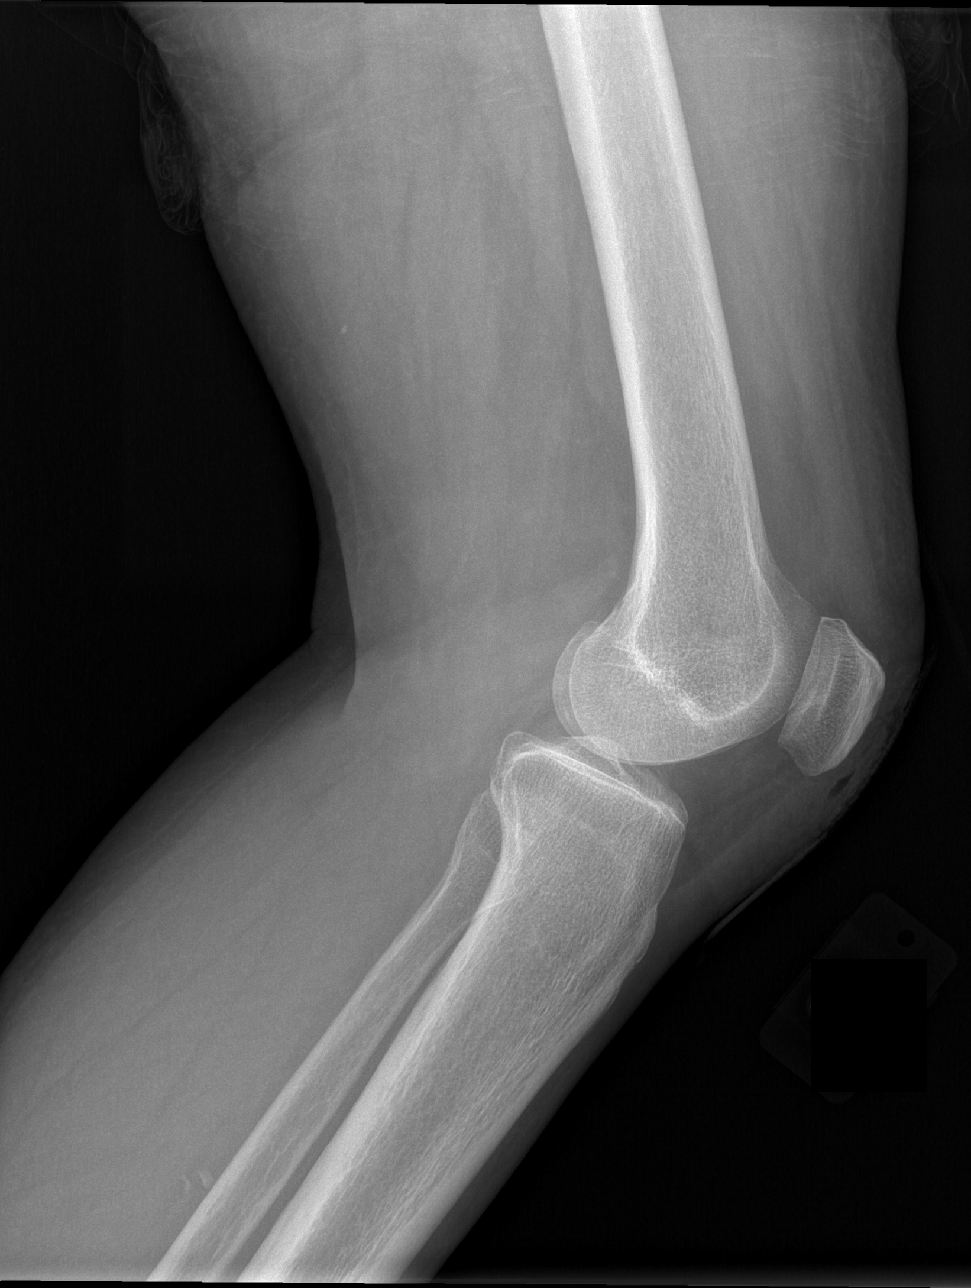

[4 of 4 positions shown; findings below may reference images not displayed]

FINDINGS: There is no evidence of fracture, dislocation, or joint effusion.
There is no evidence of arthropathy or other focal bone abnormality.
Soft tissue gas is noted anterior to patella consistent with
traumatic injury.
IMPRESSION: No fracture or dislocation is noted.

## 2016-12-12 ENCOUNTER — Ambulatory Visit (INDEPENDENT_AMBULATORY_CARE_PROVIDER_SITE_OTHER): Payer: BLUE CROSS/BLUE SHIELD | Admitting: Podiatry

## 2016-12-12 ENCOUNTER — Encounter: Payer: Self-pay | Admitting: Podiatry

## 2016-12-12 ENCOUNTER — Ambulatory Visit (INDEPENDENT_AMBULATORY_CARE_PROVIDER_SITE_OTHER): Payer: BLUE CROSS/BLUE SHIELD

## 2016-12-12 ENCOUNTER — Other Ambulatory Visit: Payer: Self-pay | Admitting: Podiatry

## 2016-12-12 DIAGNOSIS — M779 Enthesopathy, unspecified: Secondary | ICD-10-CM | POA: Diagnosis not present

## 2016-12-12 DIAGNOSIS — M2042 Other hammer toe(s) (acquired), left foot: Secondary | ICD-10-CM

## 2016-12-12 DIAGNOSIS — M21619 Bunion of unspecified foot: Secondary | ICD-10-CM | POA: Diagnosis not present

## 2016-12-12 DIAGNOSIS — L84 Corns and callosities: Secondary | ICD-10-CM | POA: Diagnosis not present

## 2016-12-12 DIAGNOSIS — M79675 Pain in left toe(s): Secondary | ICD-10-CM

## 2016-12-12 MED ORDER — TRIAMCINOLONE ACETONIDE 10 MG/ML IJ SUSP
10.0000 mg | Freq: Once | INTRAMUSCULAR | Status: AC
  Administered 2016-12-12: 10 mg

## 2016-12-12 NOTE — Progress Notes (Signed)
Subjective:    Patient ID: Carla [name], female   DOB: 61 y.o.   MRN: [id number]   HPI patient presents stating she's developed a lot of pain in the second toe over left foot making shoe gear difficult neurovascular status intact negative Homans sign was noted with patient found to have inflammatory changes second digit left foot at the interphalangeal joint with keratotic tissue formation and fluid buildup    ROS      Objective:  Physical Exam above-mentioned     Assessment:  Inflammatory capsulitis with hammertoe deformity and structural bunion deformity second digit left and first metatarsal left       Plan:   H&P condition reviewed and at this time I went ahead did a proximal nerve block of the second toe I then applied a small amount of injection to the interphalangeal joint of dexamethasone Kenalog debrided the lesion fully applied padding and advised on reduced activity. Reviewed x-rays with patient  X-rays indicate that there is pressure between the hallux second toe secondary to structural bunion with hammertoe deformity

## 2016-12-26 ENCOUNTER — Other Ambulatory Visit: Payer: Self-pay | Admitting: Internal Medicine

## 2016-12-26 NOTE — Telephone Encounter (Signed)
Refill x 6 months 

## 2016-12-31 ENCOUNTER — Other Ambulatory Visit: Payer: Self-pay | Admitting: Internal Medicine

## 2017-01-05 ENCOUNTER — Encounter (INDEPENDENT_AMBULATORY_CARE_PROVIDER_SITE_OTHER): Payer: BLUE CROSS/BLUE SHIELD

## 2017-01-12 ENCOUNTER — Ambulatory Visit (INDEPENDENT_AMBULATORY_CARE_PROVIDER_SITE_OTHER): Payer: BLUE CROSS/BLUE SHIELD | Admitting: Family Medicine

## 2017-01-12 ENCOUNTER — Encounter (INDEPENDENT_AMBULATORY_CARE_PROVIDER_SITE_OTHER): Payer: Self-pay | Admitting: Family Medicine

## 2017-01-12 VITALS — BP 114/70 | HR 69 | Temp 98.1°F | Ht 61.0 in | Wt 179.0 lb

## 2017-01-12 DIAGNOSIS — E7849 Other hyperlipidemia: Secondary | ICD-10-CM

## 2017-01-12 DIAGNOSIS — R0602 Shortness of breath: Secondary | ICD-10-CM | POA: Insufficient documentation

## 2017-01-12 DIAGNOSIS — E119 Type 2 diabetes mellitus without complications: Secondary | ICD-10-CM | POA: Diagnosis not present

## 2017-01-12 DIAGNOSIS — I10 Essential (primary) hypertension: Secondary | ICD-10-CM

## 2017-01-12 DIAGNOSIS — R5383 Other fatigue: Secondary | ICD-10-CM | POA: Diagnosis not present

## 2017-01-12 DIAGNOSIS — E559 Vitamin D deficiency, unspecified: Secondary | ICD-10-CM

## 2017-01-12 DIAGNOSIS — Z6833 Body mass index (BMI) 33.0-33.9, adult: Secondary | ICD-10-CM | POA: Diagnosis not present

## 2017-01-12 DIAGNOSIS — Z1331 Encounter for screening for depression: Secondary | ICD-10-CM

## 2017-01-12 DIAGNOSIS — E784 Other hyperlipidemia: Secondary | ICD-10-CM

## 2017-01-12 DIAGNOSIS — E669 Obesity, unspecified: Secondary | ICD-10-CM | POA: Diagnosis not present

## 2017-01-12 DIAGNOSIS — E66811 Obesity, class 1: Secondary | ICD-10-CM

## 2017-01-12 DIAGNOSIS — Z0289 Encounter for other administrative examinations: Secondary | ICD-10-CM

## 2017-01-12 DIAGNOSIS — Z1389 Encounter for screening for other disorder: Secondary | ICD-10-CM

## 2017-01-12 NOTE — Progress Notes (Signed)
.  Office: [phone]  /  Fax: [phone]   HPI:   Chief Complaint: OBESITY  Carla [name] (MR# [id number]) is a 61 y.o. female who presents on 01/12/2017 for obesity evaluation and treatment. Current BMI is Body mass index is 33.82 kg/m.Marland Kitchen Carla [name] has struggled with obesity for years and has been unsuccessful in either losing weight or maintaining long term weight loss. Carla [name] attended our information session and states she is currently in the action stage of change and ready to dedicate time achieving and maintaining a healthier weight.  Carla [name] states her family eats meals together she thinks her family will eat healthier with  her her desired weight loss is 50 lbs her heaviest weight ever was 180 lbs. she has significant food cravings issues  she snacks frequently in the evenings she skips meals frequently she is frequently drinking liquids with calories she frequently makes poor food choices she has binge eating behaviors she struggles with emotional eating    Fatigue Carla [name] feels her energy is lower than it should be. This has worsened with weight gain and has not worsened recently. Carla [name] admits to daytime somnolence and  admits to waking up still tired. Patient is at risk for obstructive sleep apnea. Patent has a history of symptoms of daytime fatigue, morning fatigue and hypertension. Patient generally gets 6 hours of sleep per night, and states they generally have restless sleep. Snoring is present. Apneic episodes are present. Epworth Sleepiness Score is 13  Dyspnea on exertion Carla [name] notes increasing shortness of breath with exercising and seems to be worsening over time with weight gain. She notes getting out of breath sooner with activity than she used to. This has not gotten worse recently. Carla [name] denies orthopnea.  Vitamin D deficiency Carla [name] has a diagnosis of vitamin D deficiency. She is currently taking vit D, no recent labs and she admits fatigue but denies nausea, vomiting  or muscle weakness.  Diabetes II Carla [name] has a diagnosis of diabetes type II. She discontinued Glipizide as she states it gave her diarrhea (so did Metformin) and [name] denies any hypoglycemic episodes. She really wants to control her blood glucose levels with intensive lifestyle modifications including diet, exercise, and weight loss.  Hypertension Carla [name] is a 60 y.o. female with hypertension and is currently taking ramipril.  Carla [name] denies chest pain or headache. She is attempting weight loss to help control her blood pressure with the goal of decreasing her risk of heart attack and stroke. Carla [name] blood pressure is well controlled today.  Hyperlipidemia Carla [name] has hyperlipidemia and is currently on atorvastatin. She is attempting to improve her cholesterol levels with intensive lifestyle modification including a low saturated fat diet, exercise and weight loss. She denies any chest pain, claudication or myalgias.  Depression Screen Carla [name] Food and Mood (modified PHQ-9) score was  Depression screen PHQ 2/9 01/12/2017  Decreased Interest 3  Down, Depressed, Hopeless 1  PHQ - 2 Score 4  Altered sleeping 3  Tired, decreased energy 3  Change in appetite 1  Feeling bad or failure about yourself  1  Trouble concentrating 0  Moving slowly or fidgety/restless 0  Suicidal thoughts 0  PHQ-9 Score 12  Difficult doing work/chores Somewhat difficult    ALLERGIES: Allergies  Allergen Reactions   Cephalexin Nausea And Vomiting   Codeine Hives   Dust Mite Extract    Mold Extract [Trichophyton]     MEDICATIONS: Current Outpatient Prescriptions on File Prior to Visit  Medication Sig Dispense  Refill   ALPRAZolam (XANAX) 0.5 MG tablet TAKE 1/2-1 TABLET BY MOUTH TWICE DAILY AS NEEDED FOR ANXIETY 60 tablet 5   atorvastatin (LIPITOR) 20 MG tablet TAKE 1 TABLET BY MOUTH DAILY 90 tablet 1   cetirizine (ZYRTEC) 10 MG tablet Take 10 mg by mouth daily.     montelukast  (SINGULAIR) 10 MG tablet TAKE 1 TABLET BY MOUTH DAILY AT BEDTIME 90 tablet 3   Multiple Vitamins-Minerals (MULTIVITAMIN WITH MINERALS) tablet Take 1 tablet by mouth daily.     ramipril (ALTACE) 5 MG capsule TAKE 1 CAPSULE BY MOUTH TWICE DAILY 60 capsule 5   cyclobenzaprine (FLEXERIL) 10 MG tablet Take 1 tablet (10 mg total) by mouth 3 (three) times daily as needed for muscle spasms. (Patient not taking: Reported on 10/20/2016) 30 tablet 0   glipiZIDE (GLUCOTROL) 10 MG tablet Take 1 tablet (10 mg total) by mouth 2 (two) times daily before a meal. (Patient not taking: Reported on 01/12/2017) 60 tablet 0   Glycopyrrolate-Formoterol (BEVESPI AEROSPHERE) 9-4.8 MCG/ACT AERO Inhale 9 mcg into the lungs as directed. (Patient not taking: Reported on 10/20/2016) 1 Inhaler 0   No current facility-administered medications on file prior to visit.     PAST MEDICAL HISTORY: Past Medical History:  Diagnosis Date   Allergic rhinitis    Allergy    rhinitis   Diabetes mellitus    Heel spur, right    Hyperlipidemia    Hypertension    Metabolic syndrome    Osteoarthritis    Osteopenia    Plantar fasciitis    Prediabetes    Syncope and collapse 09/19/2014   Type 2 HSV infection of vulvovaginal region    Vitamin D deficiency     PAST SURGICAL HISTORY: Past Surgical History:  Procedure Laterality Date   ABDOMINAL HYSTERECTOMY     COLONOSCOPY     JOINT REPLACEMENT     l hip   phalangectomy     rt 2nd toe   TOTAL HIP ARTHROPLASTY     right/02/2011    SOCIAL HISTORY: Social History  Substance Use Topics   Smoking status: Never Smoker   Smokeless tobacco: Never Used   Alcohol use 0.0 oz/week     Comment: occasional wine    FAMILY HISTORY: Family History  Problem Relation Age of Onset   Hypertension Mother    Osteoporosis Mother    Parkinson's disease Mother    Cancer Father    Mental illness Father    Parkinson's disease Father    Diabetes Brother     Heart disease Maternal Grandfather    Breast cancer Neg Hx     ROS: Review of Systems  Constitutional: Positive for malaise/fatigue.  HENT: Positive for congestion (nasal stuffiness).        Nasal Discharge  Eyes:       Wear Glasses or Contacts  Respiratory: Positive for cough.   Cardiovascular: Negative for chest pain, orthopnea and claudication.  Gastrointestinal: Negative for nausea and vomiting.  Musculoskeletal: Positive for back pain. Negative for myalgias.       Negative muscle weakness  Neurological: Negative for headaches.  Psychiatric/Behavioral:       Stress     PHYSICAL EXAM: Blood pressure 114/70, pulse 69, temperature 98.1 F (36.7 C), temperature source Oral, height 5\' 1"  (1.549 m), weight 179 lb (81.2 kg), SpO2 96 %. Body mass index is 33.82 kg/m. Physical Exam  Constitutional: She is oriented to person, place, and time. She appears well-developed and well-nourished.  Cardiovascular:  Normal rate.   Pulmonary/Chest: Effort normal.  Musculoskeletal: Normal range of motion.  Neurological: She is oriented to person, place, and time.  Skin: Skin is warm and dry.  Psychiatric: She has a normal mood and affect. Her behavior is normal.  Vitals reviewed.   RECENT LABS AND TESTS: BMET    Component Value Date/Time   NA 141 10/17/2016 1109   K 4.6 10/17/2016 1109   CL 103 10/17/2016 1109   CO2 29 10/17/2016 1109   GLUCOSE 100 (H) 10/17/2016 1109   BUN 16 10/17/2016 1109   CREATININE 0.75 10/17/2016 1109   CALCIUM 9.3 10/17/2016 1109   GFRNONAA 85 10/17/2016 1109   GFRAA >89 10/17/2016 1109   Lab Results  Component Value Date   HGBA1C 5.9 (H) 10/17/2016   No results found for: INSULIN CBC    Component Value Date/Time   WBC 6.3 10/17/2016 1109   RBC 4.17 10/17/2016 1109   HGB 12.5 10/17/2016 1109   HCT 38.8 10/17/2016 1109   PLT 285 10/17/2016 1109   MCV 93.0 10/17/2016 1109   MCH 30.0 10/17/2016 1109   MCHC 32.2 10/17/2016 1109   RDW 13.6  10/17/2016 1109   LYMPHSABS 2,457 10/17/2016 1109   MONOABS 567 10/17/2016 1109   EOSABS 189 10/17/2016 1109   BASOSABS 63 10/17/2016 1109   Iron/TIBC/Ferritin/ %Sat No results found for: IRON, TIBC, FERRITIN, IRONPCTSAT Lipid Panel     Component Value Date/Time   CHOL 169 10/17/2016 1109   TRIG 150 (H) 10/17/2016 1109   HDL 62 10/17/2016 1109   CHOLHDL 2.7 10/17/2016 1109   VLDL 30 10/17/2016 1109   LDLCALC 77 10/17/2016 1109   Hepatic Function Panel     Component Value Date/Time   PROT 6.7 10/17/2016 1109   ALBUMIN 3.8 10/17/2016 1109   AST 18 10/17/2016 1109   ALT 15 10/17/2016 1109   ALKPHOS 74 10/17/2016 1109   BILITOT 0.6 10/17/2016 1109   BILIDIR 0.1 02/12/2016 1029   IBILI 0.3 02/12/2016 1029      Component Value Date/Time   TSH 1.72 10/17/2016 1109   TSH 2.94 08/07/2015 0909   TSH 2.770 09/08/2014 1125    ECG  shows NSR with a rate of 69 BPM INDIRECT CALORIMETER done today shows a VO2 of 204 and a REE of 1422. Her calculated basal metabolic rate is 4098 thus her basal metabolic rate is better than expected.    ASSESSMENT AND PLAN: Other fatigue - Plan: EKG 12-Lead, CBC With Differential, Vitamin B12, Folate, T3, T4, free, TSH  Vitamin D deficiency - Plan: VITAMIN D 25 Hydroxy (Vit-D Deficiency, Fractures)  Type 2 diabetes mellitus without complication, without long-term current use of insulin (HCC) - Plan: Comprehensive metabolic panel, Hemoglobin A1c, Insulin, random  Other hyperlipidemia - Plan: Lipid Panel With LDL/HDL Ratio  Essential hypertension  Depression screening  Shortness of breath on exertion  Class 1 obesity with serious comorbidity and body mass index (BMI) of 33.0 to 33.9 in adult, unspecified obesity type  PLAN:  Fatigue Senora was informed that her fatigue may be related to obesity, depression or many other causes. Labs will be ordered, and in the meanwhile Renie has agreed to work on diet, exercise and weight loss to help with  fatigue. Proper sleep hygiene was discussed including the need for 7-8 hours of quality sleep each night. A sleep study was not ordered based on symptoms and Epworth score.  Dyspnea on exertion Valora's shortness of breath appears to be obesity related  and exercise induced. She has agreed to work on weight loss and gradually increase exercise to treat her exercise induced shortness of breath. If Shruti follows our instructions and loses weight without improvement of her shortness of breath, we will plan to refer to pulmonology. We will monitor this condition regularly. Carla [name] agrees to this plan.  Vitamin D Deficiency Keyani was informed that low vitamin D levels contributes to fatigue and are associated with obesity, breast, and colon cancer. She agrees to continue to take Vit D for now and we will check labs and will follow up for routine testing of vitamin D, at least 2-3 times per year. She was informed of the risk of over-replacement of vitamin D and agrees to not increase her dose unless he discusses this with [place] first.  Diabetes II Carla [name] has been given extensive diabetes education by myself today including ideal fasting and post-prandial blood glucose readings, individual ideal Hgb A1c goals  and hypoglycemia prevention. We discussed the importance of good blood sugar control to decrease the likelihood of diabetic complications such as nephropathy, neuropathy, limb loss, blindness, coronary artery disease, and death. We discussed the importance of intensive lifestyle modification including diet, exercise and weight loss as the first line treatment for diabetes. Carla [name] agrees to continue with diet, exercise and weight loss. We will check labs and she will follow up at the agreed upon time.  Hypertension We discussed sodium restriction, working on healthy weight loss, and a regular exercise program as the means to achieve improved blood pressure control. Carla [name] agreed with this plan and agreed to follow  up as directed. We will continue to monitor her blood pressure as well as her progress with the above lifestyle modifications. We will check labs and she will continue her medications as prescribed and will watch for signs of hypotension as she continues her lifestyle modifications.  Hyperlipidemia Carla [name] was informed of the American Heart Association Guidelines emphasizing intensive lifestyle modifications as the first line treatment for hyperlipidemia. We discussed many lifestyle modifications today in depth, and Carla [name] will continue to work on decreasing saturated fats such as fatty red meat, butter and many fried foods. She will also increase vegetables and lean protein in her diet and continue to work on exercise and weight loss efforts. She will continue her medications as prescribed. We will check labs and Carla [name] agrees to follow up in 2 weeks.  Depression Screen Carla [name] had a moderately positive depression screening. Depression is commonly associated with obesity and often results in emotional eating behaviors. We will monitor this closely and work on CBT to help improve the non-hunger eating patterns. Referral to Psychology may be required if no improvement is seen as she continues in our clinic.  Obesity Carla [name] is currently in the action stage of change and her goal is to continue with weight loss efforts She has agreed to follow the Category 2 plan Dorianna has been instructed to work up to a goal of 150 minutes of combined cardio and strengthening exercise per week for weight loss and overall health benefits. We discussed the following Behavioral Modification Strategies today: increasing lean protein intake, decreasing simple carbohydrates , decrease eating out and work on meal planning and easy cooking plans  Jarvis has agreed to follow up with our clinic in 2 weeks. She was informed of the importance of frequent follow up visits to maximize her success with intensive lifestyle modifications for her  multiple health conditions. She was informed we would discuss her lab results  at her next visit unless there is a critical issue that needs to be addressed sooner. Latravia agreed to keep her next visit at the agreed upon time to discuss these results.  I, Doreene Nest, am acting as transcriptionist for Dennard Nip, MD  I have reviewed the above documentation for accuracy and completeness, and I agree with the above. -Dennard Nip, MD    OBESITY BEHAVIORAL INTERVENTION VISIT  Today's visit was # 1 out of 78.  Starting weight: 179 lbs Starting date: 01/12/17 Today's weight : 179 lbs Today's date: 01/12/2017 Total lbs lost to date: 0 (Patients must lose 7 lbs in the first 6 months to continue with counseling)   ASK: We discussed the diagnosis of obesity with Oletha Blend today and Julina agreed to give [place] permission to discuss obesity behavioral modification therapy today.  ASSESS: Kirra has the diagnosis of obesity and her BMI today is 33.84 Hudsyn is in the action stage of change   ADVISE: Ainslee was educated on the multiple health risks of obesity as well as the benefit of weight loss to improve her health. She was advised of the need for long term treatment and the importance of lifestyle modifications.  AGREE: Multiple dietary modification options and treatment options were discussed and  Jeannette agreed to follow the Category 2 plan We discussed the following Behavioral Modification Strategies today: increasing lean protein intake, decreasing simple carbohydrates , decrease eating out and work on meal planning and easy cooking plans

## 2017-01-13 LAB — CBC WITH DIFFERENTIAL
BASOS ABS: 0.1 10*3/uL (ref 0.0–0.2)
Basos: 1 %
EOS (ABSOLUTE): 0.2 10*3/uL (ref 0.0–0.4)
Eos: 3 %
Hematocrit: 38.2 % (ref 34.0–46.6)
Hemoglobin: 12.2 g/dL (ref 11.1–15.9)
IMMATURE GRANS (ABS): 0 10*3/uL (ref 0.0–0.1)
Immature Granulocytes: 0 %
Lymphocytes Absolute: 2.3 10*3/uL (ref 0.7–3.1)
Lymphs: 36 %
MCH: 30 pg (ref 26.6–33.0)
MCHC: 31.9 g/dL (ref 31.5–35.7)
MCV: 94 fL (ref 79–97)
Monocytes Absolute: 0.6 10*3/uL (ref 0.1–0.9)
Monocytes: 9 %
NEUTROS ABS: 3.2 10*3/uL (ref 1.4–7.0)
NEUTROS PCT: 51 %
RBC: 4.07 x10E6/uL (ref 3.77–5.28)
RDW: 13.7 % (ref 12.3–15.4)
WBC: 6.4 10*3/uL (ref 3.4–10.8)

## 2017-01-13 LAB — COMPREHENSIVE METABOLIC PANEL
A/G RATIO: 1.6 (ref 1.2–2.2)
ALT: 17 IU/L (ref 0–32)
AST: 21 IU/L (ref 0–40)
Albumin: 4.2 g/dL (ref 3.6–4.8)
Alkaline Phosphatase: 98 IU/L (ref 39–117)
BILIRUBIN TOTAL: 0.5 mg/dL (ref 0.0–1.2)
BUN/Creatinine Ratio: 24 (ref 12–28)
BUN: 17 mg/dL (ref 8–27)
CHLORIDE: 101 mmol/L (ref 96–106)
CO2: 26 mmol/L (ref 20–29)
Calcium: 9.3 mg/dL (ref 8.7–10.3)
Creatinine, Ser: 0.72 mg/dL (ref 0.57–1.00)
GFR calc Af Amer: 103 mL/min/{1.73_m2} (ref >59)
GFR calc non Af Amer: 89 mL/min/{1.73_m2} (ref >59)
Globulin, Total: 2.6 g/dL (ref 1.5–4.5)
Glucose: 92 mg/dL (ref 65–99)
POTASSIUM: 4.6 mmol/L (ref 3.5–5.2)
Sodium: 142 mmol/L (ref 134–144)
Total Protein: 6.8 g/dL (ref 6.0–8.5)

## 2017-01-13 LAB — FOLATE: Folate: >20 ng/mL (ref >3.0)

## 2017-01-13 LAB — LIPID PANEL WITH LDL/HDL RATIO
Cholesterol, Total: 206 mg/dL — ABNORMAL HIGH (ref 100–199)
HDL: 58 mg/dL (ref >39)
LDL Calculated: 102 mg/dL — ABNORMAL HIGH (ref 0–99)
LDL/HDL RATIO: 1.8 ratio (ref 0.0–3.2)
Triglycerides: 230 mg/dL — ABNORMAL HIGH (ref 0–149)
VLDL Cholesterol Cal: 46 mg/dL — ABNORMAL HIGH (ref 5–40)

## 2017-01-13 LAB — VITAMIN B12: VITAMIN B 12: 696 pg/mL (ref 232–1245)

## 2017-01-13 LAB — HEMOGLOBIN A1C
ESTIMATED AVERAGE GLUCOSE: 128 mg/dL
HEMOGLOBIN A1C: 6.1 % — AB (ref 4.8–5.6)

## 2017-01-13 LAB — VITAMIN D 25 HYDROXY (VIT D DEFICIENCY, FRACTURES): Vit D, 25-Hydroxy: 33.3 ng/mL (ref 30.0–100.0)

## 2017-01-13 LAB — T3: T3, Total: 94 ng/dL (ref 71–180)

## 2017-01-13 LAB — TSH: TSH: 1.94 u[IU]/mL (ref 0.450–4.500)

## 2017-01-13 LAB — INSULIN, RANDOM: INSULIN: 9.8 u[IU]/mL (ref 2.6–24.9)

## 2017-01-13 LAB — T4, FREE: FREE T4: 1.2 ng/dL (ref 0.82–1.77)

## 2017-01-23 ENCOUNTER — Encounter: Payer: Self-pay | Admitting: Internal Medicine

## 2017-01-23 ENCOUNTER — Ambulatory Visit (INDEPENDENT_AMBULATORY_CARE_PROVIDER_SITE_OTHER): Payer: BLUE CROSS/BLUE SHIELD | Admitting: Internal Medicine

## 2017-01-23 VITALS — BP 130/80 | HR 73 | Temp 99.0°F | Wt 174.0 lb

## 2017-01-23 DIAGNOSIS — S90122D Contusion of left lesser toe(s) without damage to nail, subsequent encounter: Secondary | ICD-10-CM | POA: Diagnosis not present

## 2017-01-23 DIAGNOSIS — Z6833 Body mass index (BMI) 33.0-33.9, adult: Secondary | ICD-10-CM

## 2017-01-23 DIAGNOSIS — E119 Type 2 diabetes mellitus without complications: Secondary | ICD-10-CM | POA: Diagnosis not present

## 2017-01-23 DIAGNOSIS — L259 Unspecified contact dermatitis, unspecified cause: Secondary | ICD-10-CM | POA: Diagnosis not present

## 2017-01-23 MED ORDER — PREDNISONE 10 MG PO TABS: ORAL_TABLET | ORAL | 0 refills | Status: DC

## 2017-01-23 MED ORDER — METHYLPREDNISOLONE ACETATE 80 MG/ML IJ SUSP
80.0000 mg | Freq: Once | INTRAMUSCULAR | Status: AC
  Administered 2017-01-23: 80 mg via INTRAMUSCULAR

## 2017-01-23 NOTE — Patient Instructions (Addendum)
Soak toe in [place] salt water 20 minutes once or twice daily. Depo-Medrol 80 mg IM followed by prednisone 10 mg per sees #21) going from 60 mg to 0 mg over 7 days for contact dermatitis. If rash does not improve within 7 days, refer to dermatologist. Written prescription for FreeStyle Libre home glucose monitor

## 2017-01-23 NOTE — Progress Notes (Signed)
   Subjective:    Patient ID: Carla [name], female    DOB: [birth year], 61 y.o.   MRN: [id number]  HPI 61 year old Female in today with rash onset several days ago. She is a massage therapist. She and her husband when out of town a week ago to [place] and stayed in 2 different hotels. Doesn't recall being around any poison ivy or other plants that would have caused dermatitis. Rash is itchy. It's on several parts of her body. This includes her back, right arm and beginning on her left wrist.  Also, she wants prescription for FreeStyle Libre home glucose monitor  Also, has issue with second toe left foot. Says it started after she is wearing sandals. She went to an urgent care. She had an x-ray that revealed no fracture. It looks contused with some swelling and redness. Denies any trauma to the toe. Not tender to touch.    Review of Systems see above     Objective:   Physical Exam Left second toe is slightly swollen with erythema between MTP and IP joint. Appears to be slightly contused. She can flex the toe.  She has vesicular lesions on her right forearm and also some discrete papules on her right forearm. These are itchy. She has been taking Benadryl.  Has a single papule left wrist and several papules mid lower back.       Assessment & Plan:  I think she probably has a contact dermatitis although some of these papular lesions do resemble chiggers. The vesicular lesion on her right forearm would rule out out. He is fairly extensive on her right forearm. All of this seems to be related according to patient and occurred around the same time. She was given Depo-Medrol 80 mg IM and placed on a prednisone taper 10 mg tablets going from 60 mg to 0 mg over 7 days. She may use calamine on these lesions and may take Benadryl if itching.  Probable contusion left second toe-reassured this will likely improve  Flu vaccine-hold off on this until she is off prednisone  Controlled type 2  diabetes mellitus-given written prescription for FreeStyle Libre home glucose monitor  Obesity-has appointment this week with Dr. Leafy Ro

## 2017-01-25 ENCOUNTER — Ambulatory Visit (INDEPENDENT_AMBULATORY_CARE_PROVIDER_SITE_OTHER): Payer: BLUE CROSS/BLUE SHIELD | Admitting: Family Medicine

## 2017-01-25 VITALS — BP 122/76 | HR 67 | Temp 98.1°F | Ht 61.0 in | Wt 178.0 lb

## 2017-01-25 DIAGNOSIS — Z6833 Body mass index (BMI) 33.0-33.9, adult: Secondary | ICD-10-CM

## 2017-01-25 DIAGNOSIS — E559 Vitamin D deficiency, unspecified: Secondary | ICD-10-CM

## 2017-01-25 DIAGNOSIS — E784 Other hyperlipidemia: Secondary | ICD-10-CM

## 2017-01-25 DIAGNOSIS — E119 Type 2 diabetes mellitus without complications: Secondary | ICD-10-CM | POA: Diagnosis not present

## 2017-01-25 DIAGNOSIS — E7849 Other hyperlipidemia: Secondary | ICD-10-CM

## 2017-01-25 DIAGNOSIS — E669 Obesity, unspecified: Secondary | ICD-10-CM | POA: Diagnosis not present

## 2017-01-25 MED ORDER — VITAMIN D (ERGOCALCIFEROL) 1.25 MG (50000 UNIT) PO CAPS: 50000.0000 [IU] | ORAL_CAPSULE | ORAL | 0 refills | Status: DC

## 2017-01-25 NOTE — Progress Notes (Signed)
Office: [phone]  /  Fax: [phone]   HPI:   Chief Complaint: OBESITY Carla [name] is here to discuss her progress with her obesity treatment plan. She is on the Category 2 plan and is following her eating plan approximately 100 % of the time. She states she is exercising 0 minutes 0 times per week. Carla [name] was on vacation and was unable to follow her plan most of the time. Her weight is 178 lb (80.7 kg) today and has had a weight loss of 1 pound over a period of 2 weeks since her last visit. She has lost 1 lb since starting treatment with [place].  Vitamin D deficiency Carla [name] has a diagnosis of vitamin D deficiency. She is currently taking OTC vit D @2 ,000 IU daily and level is okay but not at ideal range. Carla [name] denies nausea, vomiting or muscle weakness.  Hyperlipidemia Carla [name] has hyperlipidemia, LDL is elevated at 102 and she is currently taking Lipitor. Carla [name] would like to concentrate on intensive lifestyle modification including a low saturated fat diet, exercise and weight loss to get additional improvement in her cholesterol levels. She denies any chest pain, claudication or myalgias.  Diabetes II Carla [name] has a diagnosis of diabetes type II. Carla [name] denies any hypoglycemic episodes. Last A1c was at 6.1 She has been attempting to control her diabetes with intensive lifestyle modifications including diet, exercise, and weight loss to help control her blood glucose levels.  ALLERGIES: Allergies  Allergen Reactions  . Cephalexin Nausea And Vomiting  . Codeine Hives  . Dust Mite Extract   . Mold Extract [Trichophyton]     MEDICATIONS: Current Outpatient Prescriptions on File Prior to Visit  Medication Sig Dispense Refill  . ALPRAZolam (XANAX) 0.5 MG tablet TAKE 1/2-1 TABLET BY MOUTH TWICE DAILY AS NEEDED FOR ANXIETY 60 tablet 5  . atorvastatin (LIPITOR) 20 MG tablet TAKE 1 TABLET BY MOUTH DAILY 90 tablet 1  . cetirizine (ZYRTEC) 10 MG tablet Take 10 mg by mouth daily.    . cyclobenzaprine  (FLEXERIL) 10 MG tablet Take 1 tablet (10 mg total) by mouth 3 (three) times daily as needed for muscle spasms. (Patient not taking: Reported on 10/20/2016) 30 tablet 0  . glipiZIDE (GLUCOTROL) 10 MG tablet Take 1 tablet (10 mg total) by mouth 2 (two) times daily before a meal. (Patient not taking: Reported on 01/12/2017) 60 tablet 0  . Glycopyrrolate-Formoterol (BEVESPI AEROSPHERE) 9-4.8 MCG/ACT AERO Inhale 9 mcg into the lungs as directed. (Patient not taking: Reported on 01/23/2017) 1 Inhaler 0  . montelukast (SINGULAIR) 10 MG tablet TAKE 1 TABLET BY MOUTH DAILY AT BEDTIME 90 tablet 3  . Multiple Vitamins-Minerals (MULTIVITAMIN WITH MINERALS) tablet Take 1 tablet by mouth daily.    . predniSONE (DELTASONE) 10 MG tablet Take in tapering course as directed 6-5-4-3-2-1 21 tablet 0  . ramipril (ALTACE) 5 MG capsule TAKE 1 CAPSULE BY MOUTH TWICE DAILY 60 capsule 5   No current facility-administered medications on file prior to visit.     PAST MEDICAL HISTORY: Past Medical History:  Diagnosis Date  . Allergic rhinitis   . Allergy    rhinitis  . Diabetes mellitus   . Heel spur, right   . Hyperlipidemia   . Hypertension   . Metabolic syndrome   . Osteoarthritis   . Osteopenia   . Plantar fasciitis   . Prediabetes   . Syncope and collapse 09/19/2014  . Type 2 HSV infection of vulvovaginal region   . Vitamin D deficiency  PAST SURGICAL HISTORY: Past Surgical History:  Procedure Laterality Date  . ABDOMINAL HYSTERECTOMY    . COLONOSCOPY    . JOINT REPLACEMENT     l hip  . phalangectomy     rt 2nd toe  . TOTAL HIP ARTHROPLASTY     right/02/2011    SOCIAL HISTORY: Social History  Substance Use Topics  . Smoking status: Never Smoker  . Smokeless tobacco: Never Used  . Alcohol use 0.0 oz/week     Comment: occasional wine    FAMILY HISTORY: Family History  Problem Relation Age of Onset  . Hypertension Mother   . Osteoporosis Mother   . Parkinson's disease Mother   . Cancer  Father   . Mental illness Father   . Parkinson's disease Father   . Diabetes Brother   . Heart disease Maternal Grandfather   . Breast cancer Neg Hx     ROS: Review of Systems  Constitutional: Positive for weight loss.  Cardiovascular: Negative for chest pain and claudication.  Gastrointestinal: Negative for nausea and vomiting.  Musculoskeletal: Negative for myalgias.       Negative muscle weakness  Endo/Heme/Allergies:       Negative hypoglycemia    PHYSICAL EXAM: Blood pressure 122/76, pulse 67, temperature 98.1 F (36.7 C), height 5\' 1"  (1.549 m), weight 178 lb (80.7 kg), SpO2 97 %. Body mass index is 33.63 kg/m. Physical Exam  Constitutional: She is oriented to person, place, and time. She appears well-developed and well-nourished.  Cardiovascular: Normal rate.   Pulmonary/Chest: Effort normal.  Musculoskeletal: Normal range of motion.  Neurological: She is oriented to person, place, and time.  Skin: Skin is warm and dry.  Psychiatric: She has a normal mood and affect. Her behavior is normal.  Vitals reviewed.   RECENT LABS AND TESTS: BMET    Component Value Date/Time   NA 142 01/12/2017 1040   K 4.6 01/12/2017 1040   CL 101 01/12/2017 1040   CO2 26 01/12/2017 1040   GLUCOSE 92 01/12/2017 1040   GLUCOSE 100 (H) 10/17/2016 1109   BUN 17 01/12/2017 1040   CREATININE 0.72 01/12/2017 1040   CREATININE 0.75 10/17/2016 1109   CALCIUM 9.3 01/12/2017 1040   GFRNONAA 89 01/12/2017 1040   GFRNONAA 85 10/17/2016 1109   GFRAA 103 01/12/2017 1040   GFRAA >89 10/17/2016 1109   Lab Results  Component Value Date   HGBA1C 6.1 (H) 01/12/2017   HGBA1C 5.9 (H) 10/17/2016   HGBA1C 5.8 (H) 02/12/2016   HGBA1C 6.1 (H) 08/07/2015   HGBA1C 6.1 (H) 01/23/2015   Lab Results  Component Value Date   INSULIN 9.8 01/12/2017   CBC    Component Value Date/Time   WBC 6.4 01/12/2017 1040   WBC 6.3 10/17/2016 1109   RBC 4.07 01/12/2017 1040   RBC 4.17 10/17/2016 1109   HGB  12.2 01/12/2017 1040   HCT 38.2 01/12/2017 1040   PLT 285 10/17/2016 1109   MCV 94 01/12/2017 1040   MCH 30.0 01/12/2017 1040   MCH 30.0 10/17/2016 1109   MCHC 31.9 01/12/2017 1040   MCHC 32.2 10/17/2016 1109   RDW 13.7 01/12/2017 1040   LYMPHSABS 2.3 01/12/2017 1040   MONOABS 567 10/17/2016 1109   EOSABS 0.2 01/12/2017 1040   BASOSABS 0.1 01/12/2017 1040   Iron/TIBC/Ferritin/ %Sat No results found for: IRON, TIBC, FERRITIN, IRONPCTSAT Lipid Panel     Component Value Date/Time   CHOL 206 (H) 01/12/2017 1040   TRIG 230 (H) 01/12/2017 1040  HDL 58 01/12/2017 1040   CHOLHDL 2.7 10/17/2016 1109   VLDL 30 10/17/2016 1109   LDLCALC 102 (H) 01/12/2017 1040   Hepatic Function Panel     Component Value Date/Time   PROT 6.8 01/12/2017 1040   ALBUMIN 4.2 01/12/2017 1040   AST 21 01/12/2017 1040   ALT 17 01/12/2017 1040   ALKPHOS 98 01/12/2017 1040   BILITOT 0.5 01/12/2017 1040   BILIDIR 0.1 02/12/2016 1029   IBILI 0.3 02/12/2016 1029      Component Value Date/Time   TSH 1.940 01/12/2017 1040   TSH 1.72 10/17/2016 1109   TSH 2.94 08/07/2015 0909    ASSESSMENT AND PLAN: Other hyperlipidemia  Vitamin D deficiency - Plan: Vitamin D, Ergocalciferol, (DRISDOL) 50000 units CAPS capsule  Type 2 diabetes mellitus without complication, without long-term current use of insulin (HCC)  Class 1 obesity with serious comorbidity and body mass index (BMI) of 33.0 to 33.9 in adult, unspecified obesity type  PLAN:  Vitamin D Deficiency Carla [name] was informed that low vitamin D levels contributes to fatigue and are associated with obesity, breast, and colon cancer. She agrees to discontinue OTC vit D and start to take prescription Vit D @50 ,000 IU every week #4 with no refills and will follow up for routine testing of vitamin D, at least 2-3 times per year. She was informed of the risk of over-replacement of vitamin D and agrees to not increase her dose unless he discusses this with [place] first.  Carla [name] agrees to follow up with our clinic in 2 weeks.  Hyperlipidemia Carla [name] was informed of the American Heart Association Guidelines emphasizing intensive lifestyle modifications as the first line treatment for hyperlipidemia. We discussed many lifestyle modifications today in depth, and Carla [name] will continue to work on decreasing saturated fats such as fatty red meat, butter and many fried foods. She will also increase vegetables and lean protein in her diet and continue to work on exercise and weight loss efforts. Carla [name] will continue Lipitor and we will re-check labs in 3 months.  Diabetes II Carla [name] has been given extensive diabetes education by myself today including ideal fasting and post-prandial blood glucose readings, individual ideal Hgb A1c goals  and hypoglycemia prevention. We discussed the importance of good blood sugar control to decrease the likelihood of diabetic complications such as nephropathy, neuropathy, limb loss, blindness, coronary artery disease, and death. We discussed the importance of intensive lifestyle modification including diet, exercise and weight loss as the first line treatment for diabetes. Carla [name] agrees to continue with diet, exercise and weight loss to help control diabetes. We will re-check labs in 3 months and Grasiela agrees to continue her diabetes medications and will follow up at the agreed upon time.  Obesity Carla [name] is currently in the action stage of change. As such, her goal is to continue with weight loss efforts She has agreed to follow the Category 2 plan Carla [name] has been instructed to work up to a goal of 150 minutes of combined cardio and strengthening exercise per week for weight loss and overall health benefits. We discussed the following Behavioral Modification Strategies today: increasing lean protein intake, decreasing simple carbohydrates and work on meal planning and easy cooking plans  Carla [name] has agreed to follow up with our clinic in 2 weeks. She was  informed of the importance of frequent follow up visits to maximize her success with intensive lifestyle modifications for her multiple health conditions.  Carla [name], am acting as transcriptionist for Dennard Nip, MD  I have reviewed the above documentation for accuracy and completeness, and I agree with the above. -Dennard Nip, MD    OBESITY BEHAVIORAL INTERVENTION VISIT  Today's visit was # 2 out of 22.  Starting weight: 179 lbs Starting date: 01/12/17 Today's weight : 178 lbs Today's date: 01/25/2017 Total lbs lost to date: 1 (Patients must lose 7 lbs in the first 6 months to continue with counseling)   ASK: We discussed the diagnosis of obesity with Carla [name] today and Carla [name] agreed to give [place] permission to discuss obesity behavioral modification therapy today.  ASSESS: Carla [name] has the diagnosis of obesity and her BMI today is 33.65 Carla [name] is in the action stage of change   ADVISE: Carla [name] was educated on the multiple health risks of obesity as well as the benefit of weight loss to improve her health. She was advised of the need for long term treatment and the importance of lifestyle modifications.  AGREE: Multiple dietary modification options and treatment options were discussed and  Carla [name] agreed to follow the Category 2 plan We discussed the following Behavioral Modification Strategies today: increasing lean protein intake, decreasing simple carbohydrates and work on meal planning and easy cooking plans

## 2017-01-27 ENCOUNTER — Telehealth: Payer: Self-pay | Admitting: Internal Medicine

## 2017-01-27 MED ORDER — PREDNISONE 10 MG PO TABS: ORAL_TABLET | ORAL | 0 refills | Status: DC

## 2017-01-27 NOTE — Telephone Encounter (Signed)
States she is doing better but the left arm is still not completely better. The lower arm is still pretty broken out.  And, she's taking Benadryl and using calamine lotion.  She said you mentioned that she may have to repeat the dose of Prednisone.  She said she has got to go back to work.  So, she wants to know what you feel needs to be done.  Repeat the Prednisone?    Pharmacy:  [facility] @ [facility] number for contact:  [phone]  Thanks.

## 2017-01-27 NOTE — Telephone Encounter (Signed)
Pt is aware.  

## 2017-01-27 NOTE — Telephone Encounter (Signed)
Repeat Prednisone taper

## 2017-02-01 MED ORDER — PREDNISONE 10 MG PO TABS: ORAL_TABLET | ORAL | 0 refills | Status: DC

## 2017-02-01 NOTE — Addendum Note (Signed)
Addended by: Drucilla Schmidt on: 02/01/2017 08:50 AM   Modules accepted: Orders

## 2017-02-06 ENCOUNTER — Ambulatory Visit (INDEPENDENT_AMBULATORY_CARE_PROVIDER_SITE_OTHER): Payer: BLUE CROSS/BLUE SHIELD | Admitting: Physician Assistant

## 2017-02-06 VITALS — BP 128/72 | HR 63 | Temp 98.2°F | Ht 61.0 in | Wt 178.0 lb

## 2017-02-06 DIAGNOSIS — Z6833 Body mass index (BMI) 33.0-33.9, adult: Secondary | ICD-10-CM | POA: Diagnosis not present

## 2017-02-06 DIAGNOSIS — E669 Obesity, unspecified: Secondary | ICD-10-CM

## 2017-02-06 DIAGNOSIS — E119 Type 2 diabetes mellitus without complications: Secondary | ICD-10-CM

## 2017-02-06 DIAGNOSIS — Z794 Long term (current) use of insulin: Secondary | ICD-10-CM

## 2017-02-06 DIAGNOSIS — K5909 Other constipation: Secondary | ICD-10-CM | POA: Diagnosis not present

## 2017-02-06 MED ORDER — POLYETHYLENE GLYCOL 3350 17 GM/SCOOP PO POWD: 17.0000 g | Freq: Every day | ORAL | 0 refills | Status: DC

## 2017-02-07 NOTE — Progress Notes (Signed)
Office: [phone]  /  Fax: [phone]   HPI:   Chief Complaint: OBESITY Carla [name] is here to discuss her progress with her obesity treatment plan. She is on the Category 2 plan and is following her eating plan approximately 100 % of the time. She states she is exercising 0 minutes 0 times per week. Nicoletta maintained her weight and states hunger is well controlled. She would like more breakfast options. Her weight is 178 lb (80.7 kg) today and has maintained weight over a period of 2 weeks since her last visit. She has lost 1 lb since starting treatment with [place].  Constipation Kalandra notes constipation for the last few weeks, worse since attempting weight loss. She states BM are less frequent and are not hard and painful. She denies hematochezia or melena.   Diabetes II Kaytee has a diagnosis of diabetes type II. Hisayo states she is not checking fasting BGs at home and denies any hypoglycemic episodes. Raizel states she could not tolerate metformin or glipizide in the past. She declined initiation of diabetes medicine today. She has been working on intensive lifestyle modifications including diet, exercise, and weight loss to help control her blood glucose levels.   ALLERGIES: Allergies  Allergen Reactions  . Cephalexin Nausea And Vomiting  . Codeine Hives  . Dust Mite Extract   . Mold Extract [Trichophyton]     MEDICATIONS: Current Outpatient Prescriptions on File Prior to Visit  Medication Sig Dispense Refill  . ALPRAZolam (XANAX) 0.5 MG tablet TAKE 1/2-1 TABLET BY MOUTH TWICE DAILY AS NEEDED FOR ANXIETY 60 tablet 5  . atorvastatin (LIPITOR) 20 MG tablet TAKE 1 TABLET BY MOUTH DAILY 90 tablet 1  . cetirizine (ZYRTEC) 10 MG tablet Take 10 mg by mouth daily.    . cyclobenzaprine (FLEXERIL) 10 MG tablet Take 1 tablet (10 mg total) by mouth 3 (three) times daily as needed for muscle spasms. (Patient not taking: Reported on 10/20/2016) 30 tablet 0  . Glycopyrrolate-Formoterol (BEVESPI  AEROSPHERE) 9-4.8 MCG/ACT AERO Inhale 9 mcg into the lungs as directed. (Patient not taking: Reported on 01/23/2017) 1 Inhaler 0  . montelukast (SINGULAIR) 10 MG tablet TAKE 1 TABLET BY MOUTH DAILY AT BEDTIME 90 tablet 3  . Multiple Vitamins-Minerals (MULTIVITAMIN WITH MINERALS) tablet Take 1 tablet by mouth daily.    . predniSONE (DELTASONE) 10 MG tablet Take in tapering course as directed 6 5 4 3 2 1 21  tablet 0  . ramipril (ALTACE) 5 MG capsule TAKE 1 CAPSULE BY MOUTH TWICE DAILY 60 capsule 5  . Vitamin D, Ergocalciferol, (DRISDOL) 50000 units CAPS capsule Take 1 capsule (50,000 Units total) by mouth every 7 (seven) days. 4 capsule 0   No current facility-administered medications on file prior to visit.     PAST MEDICAL HISTORY: Past Medical History:  Diagnosis Date  . Allergic rhinitis   . Allergy    rhinitis  . Diabetes mellitus   . Heel spur, right   . Hyperlipidemia   . Hypertension   . Metabolic syndrome   . Osteoarthritis   . Osteopenia   . Plantar fasciitis   . Prediabetes   . Syncope and collapse 09/19/2014  . Type 2 HSV infection of vulvovaginal region   . Vitamin D deficiency     PAST SURGICAL HISTORY: Past Surgical History:  Procedure Laterality Date  . ABDOMINAL HYSTERECTOMY    . COLONOSCOPY    . JOINT REPLACEMENT     l hip  . phalangectomy  rt 2nd toe  . TOTAL HIP ARTHROPLASTY     right/02/2011    SOCIAL HISTORY: Social History  Substance Use Topics  . Smoking status: Never Smoker  . Smokeless tobacco: Never Used  . Alcohol use 0.0 oz/week     Comment: occasional wine    FAMILY HISTORY: Family History  Problem Relation Age of Onset  . Hypertension Mother   . Osteoporosis Mother   . Parkinson's disease Mother   . Cancer Father   . Mental illness Father   . Parkinson's disease Father   . Diabetes Brother   . Heart disease Maternal Grandfather   . Breast cancer Neg Hx     ROS: Review of Systems  Constitutional: Negative for weight  loss.  Gastrointestinal: Positive for constipation. Negative for melena.       Negative hematochezia  Endo/Heme/Allergies:       Negative hypoglycemia    PHYSICAL EXAM: Blood pressure 128/72, pulse 63, temperature 98.2 F (36.8 C), temperature source Oral, height 5\' 1"  (1.549 m), weight 178 lb (80.7 kg), SpO2 98 %. Body mass index is 33.63 kg/m. Physical Exam  Constitutional: She is oriented to person, place, and time. She appears well-developed and well-nourished.  Cardiovascular: Normal rate.   Pulmonary/Chest: Effort normal.  Musculoskeletal: Normal range of motion.  Neurological: She is oriented to person, place, and time.  Skin: Skin is warm and dry.  Psychiatric: She has a normal mood and affect. Her behavior is normal.  Vitals reviewed.   RECENT LABS AND TESTS: BMET    Component Value Date/Time   NA 142 01/12/2017 1040   K 4.6 01/12/2017 1040   CL 101 01/12/2017 1040   CO2 26 01/12/2017 1040   GLUCOSE 92 01/12/2017 1040   GLUCOSE 100 (H) 10/17/2016 1109   BUN 17 01/12/2017 1040   CREATININE 0.72 01/12/2017 1040   CREATININE 0.75 10/17/2016 1109   CALCIUM 9.3 01/12/2017 1040   GFRNONAA 89 01/12/2017 1040   GFRNONAA 85 10/17/2016 1109   GFRAA 103 01/12/2017 1040   GFRAA >89 10/17/2016 1109   Lab Results  Component Value Date   HGBA1C 6.1 (H) 01/12/2017   HGBA1C 5.9 (H) 10/17/2016   HGBA1C 5.8 (H) 02/12/2016   HGBA1C 6.1 (H) 08/07/2015   HGBA1C 6.1 (H) 01/23/2015   Lab Results  Component Value Date   INSULIN 9.8 01/12/2017   CBC    Component Value Date/Time   WBC 6.4 01/12/2017 1040   WBC 6.3 10/17/2016 1109   RBC 4.07 01/12/2017 1040   RBC 4.17 10/17/2016 1109   HGB 12.2 01/12/2017 1040   HCT 38.2 01/12/2017 1040   PLT 285 10/17/2016 1109   MCV 94 01/12/2017 1040   MCH 30.0 01/12/2017 1040   MCH 30.0 10/17/2016 1109   MCHC 31.9 01/12/2017 1040   MCHC 32.2 10/17/2016 1109   RDW 13.7 01/12/2017 1040   LYMPHSABS 2.3 01/12/2017 1040   MONOABS  567 10/17/2016 1109   EOSABS 0.2 01/12/2017 1040   BASOSABS 0.1 01/12/2017 1040   Iron/TIBC/Ferritin/ %Sat No results found for: IRON, TIBC, FERRITIN, IRONPCTSAT Lipid Panel     Component Value Date/Time   CHOL 206 (H) 01/12/2017 1040   TRIG 230 (H) 01/12/2017 1040   HDL 58 01/12/2017 1040   CHOLHDL 2.7 10/17/2016 1109   VLDL 30 10/17/2016 1109   LDLCALC 102 (H) 01/12/2017 1040   Hepatic Function Panel     Component Value Date/Time   PROT 6.8 01/12/2017 1040   ALBUMIN 4.2 01/12/2017  1040   AST 21 01/12/2017 1040   ALT 17 01/12/2017 1040   ALKPHOS 98 01/12/2017 1040   BILITOT 0.5 01/12/2017 1040   BILIDIR 0.1 02/12/2016 1029   IBILI 0.3 02/12/2016 1029      Component Value Date/Time   TSH 1.940 01/12/2017 1040   TSH 1.72 10/17/2016 1109   TSH 2.94 08/07/2015 0909    ASSESSMENT AND PLAN: Other constipation - Plan: polyethylene glycol powder (GLYCOLAX/MIRALAX) powder  Type 2 diabetes mellitus without complication, with long-term current use of insulin (HCC)  Class 1 obesity with serious comorbidity and body mass index (BMI) of 33.0 to 33.9 in adult, unspecified obesity type  PLAN:   Constipation Mahlet was informed decrease bowel movement frequency is normal while losing weight, but stools should not be hard or painful. She was advised to increase her H20 intake and work on increasing her fiber intake. High fiber foods were discussed today. Shaylinn agrees to start Miralax and will follow up with our clinic in 2 to 3 weeks.   Diabetes II Milica has been given extensive diabetes education by myself today including ideal fasting and post-prandial blood glucose readings, individual ideal Hgb A1c goals  and hypoglycemia prevention. We discussed the importance of good blood sugar control to decrease the likelihood of diabetic complications such as nephropathy, neuropathy, limb loss, blindness, coronary artery disease, and death. We discussed the importance of intensive lifestyle  modification including diet, exercise and weight loss as the first line treatment for diabetes. Emmalia agrees to check blood sugars and continue with diet and exercise and will follow up at the agreed upon time. We will initiate Victoza at next visit (patient needs time to think about it).  Obesity Carolanne is currently in the action stage of change. As such, her goal is to continue with weight loss efforts She has agreed to follow the Category 2 plan Kendall has been instructed to work up to a goal of 150 minutes of combined cardio and strengthening exercise per week for weight loss and overall health benefits. We discussed the following Behavioral Modification Strategies today: increasing lean protein intake and ways to avoid boredom eating  Asuncion has agreed to follow up with our clinic in 2 to 3 weeks. She was informed of the importance of frequent follow up visits to maximize her success with intensive lifestyle modifications for her multiple health conditions.  I, Doreene Nest, am acting as transcriptionist for Lacy Duverney, PA-C  I have reviewed the above documentation for accuracy and completeness, and I agree with the above. -Lacy Duverney, PA-C  I have reviewed the above note and agree with the plan. -Dennard Nip, MD   OBESITY BEHAVIORAL INTERVENTION VISIT  Today's visit was # 3 out of 22.  Starting weight: 179 lbs Starting date: 01/12/17 Today's weight : 178 lbs Today's date: 02/06/2017 Total lbs lost to date: 1 (Patients must lose 7 lbs in the first 6 months to continue with counseling)   ASK: We discussed the diagnosis of obesity with Oletha Blend today and Delaine agreed to give [place] permission to discuss obesity behavioral modification therapy today.  ASSESS: Sherilyn has the diagnosis of obesity and her BMI today is 33.65 Srishti is in the action stage of change   ADVISE: Benedetta was educated on the multiple health risks of obesity as well as the benefit of weight loss to improve  her health. She was advised of the need for long term treatment and the importance of lifestyle modifications.  AGREE:  Multiple dietary modification options and treatment options were discussed and  Sharone agreed to follow the Category 2 plan We discussed the following Behavioral Modification Strategies today: increasing lean protein intake and ways to avoid boredom eating

## 2017-02-13 ENCOUNTER — Ambulatory Visit (INDEPENDENT_AMBULATORY_CARE_PROVIDER_SITE_OTHER): Payer: BLUE CROSS/BLUE SHIELD | Admitting: Podiatry

## 2017-02-13 ENCOUNTER — Encounter: Payer: Self-pay | Admitting: Podiatry

## 2017-02-13 ENCOUNTER — Ambulatory Visit (INDEPENDENT_AMBULATORY_CARE_PROVIDER_SITE_OTHER): Payer: BLUE CROSS/BLUE SHIELD

## 2017-02-13 DIAGNOSIS — M2012 Hallux valgus (acquired), left foot: Secondary | ICD-10-CM | POA: Diagnosis not present

## 2017-02-13 NOTE — Patient Instructions (Signed)
Pre-Operative Instructions  Congratulations, you have decided to take an important step towards improving your quality of life.  You can be assured that the doctors and staff at [facility] will be with you every step of the way.  Here are some important things you should know:  1. Plan to be at the surgery center/hospital at least 1 (one) hour prior to your scheduled time, unless otherwise directed by the surgical center/hospital staff.  You must have a responsible adult accompany you, remain during the surgery and drive you home.  Make sure you have directions to the surgical center/hospital to ensure you arrive on time. 2. If you are having surgery at [facility] or [facility] hospitals, you will need a copy of your medical history and physical form from your family physician within one month prior to the date of surgery. We will give you a form for your primary physician to complete.  3. We make every effort to accommodate the date you request for surgery.  However, there are times where surgery dates or times have to be moved.  We will contact you as soon as possible if a change in schedule is required.   4. No aspirin/ibuprofen for one week before surgery.  If you are on aspirin, any non-steroidal anti-inflammatory medications (Mobic, Aleve, Ibuprofen) should not be taken seven (7) days prior to your surgery.  You make take Tylenol for pain prior to surgery.  5. Medications - If you are taking daily heart and blood pressure medications, seizure, reflux, allergy, asthma, anxiety, pain or diabetes medications, make sure you notify the surgery center/hospital before the day of surgery so they can tell you which medications you should take or avoid the day of surgery. 6. No food or drink after midnight the night before surgery unless directed otherwise by surgical center/hospital staff. 7. No alcoholic beverages 24-hours prior to surgery.  No smoking 24-hours prior or 24-hours after  surgery. 8. Wear loose pants or shorts. They should be loose enough to fit over bandages, boots, and casts. 9. Don't wear slip-on shoes. Sneakers are preferred. 10. Bring your boot with you to the surgery center/hospital.  Also bring crutches or a walker if your physician has prescribed it for you.  If you do not have this equipment, it will be provided for you after surgery. 11. If you have not been contacted by the surgery center/hospital by the day before your surgery, call to confirm the date and time of your surgery. 12. Leave-time from work may vary depending on the type of surgery you have.  Appropriate arrangements should be made prior to surgery with your employer. 13. Prescriptions will be provided immediately following surgery by your doctor.  Fill these as soon as possible after surgery and take the medication as directed. Pain medications will not be refilled on weekends and must be approved by the doctor. 14. Remove nail polish on the operative foot and avoid getting pedicures prior to surgery. 15. Wash the night before surgery.  The night before surgery wash the foot and leg well with water and the antibacterial soap provided. Be sure to pay special attention to beneath the toenails and in between the toes.  Wash for at least three (3) minutes. Rinse thoroughly with water and dry well with a towel.  Perform this wash unless told not to do so by your physician.  Enclosed: 1 Ice pack (please put in freezer the night before surgery)   1 Hibiclens skin cleaner     Pre-op instructions  If you have any questions regarding the instructions, please do not hesitate to call our office.  [place]: [address], [place], [place] [address] -- [phone]  [place]: [address] [place], [place] [address] -- [phone]  [place]: [address]  [place], [place] [address] -- [phone]  [place]: [address], [address], [place],  [address] -- [phone]  Website:  [email or web address] 

## 2017-02-13 NOTE — Progress Notes (Signed)
Subjective:    Patient ID: Carla [name], female   DOB: 61 y.o.   MRN: [id number]   HPI patient states her second toe is becoming increasingly sore and it did not re-spelled well to the injection and the bunion also is bothersome    ROS      Objective:  Physical Exam neurovascular status intact with inflamed callus second digit right medial side interphalangeal joint with keratotic tissue formation but no redness or drainage and structural bunion with mild deviation of the hallux against the second toe     Assessment:    Hammertoe deformity second digit left with inflammation and structural bunion deformity left     Plan:    H&P condition reviewed and at this point I have recommended due to the non-response to injection and debridement that we go ahead and fix this problem. Patient wants this done and I allowed her to read consent form going over alternative treatments complications as listed and she is willing to accept the risk wants surgery signed consent form is given all preoperative instructions. Patient is scheduled for outpatient surgery disperse but she surgical center and will have bunion and hammertoe repair and understands all risks as outlined and was dispensed air fracture walker. Understands total recovery will take 6 months to one year  X-ray indicates that there is structural bunion with elevation of the intermetatarsal angle and hypertrophy around the second digit interphalangeal joint  left with no ostial lysis noted

## 2017-02-18 ENCOUNTER — Other Ambulatory Visit (INDEPENDENT_AMBULATORY_CARE_PROVIDER_SITE_OTHER): Payer: Self-pay | Admitting: Family Medicine

## 2017-02-18 DIAGNOSIS — E559 Vitamin D deficiency, unspecified: Secondary | ICD-10-CM

## 2017-02-21 ENCOUNTER — Telehealth: Payer: Self-pay | Admitting: *Deleted

## 2017-02-21 NOTE — Telephone Encounter (Addendum)
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 Left message informing pt of my hours and to come in and I would help her be fitted with a surgical shoe and possibly off loading padding. 02/22/2017-Pt presented for surgical shoe. I checked left medial 2nd toe and it was pinker than the other toes with a moist corn, no streaking or swelling or drainage. I put double thickness of fully opened 2 x 2 gauze over the area and diagonal across top and bottom of the foot and paper taped across the top and lateral foot. I fitted pt with a small surgical shoe and open ended sock to allow for the area to breath. I told pt if she had any increase in redness, swelling or drainage to contact our office.03/02/2017-Pt states toe is red, streaked and needs an antibiotic or an appt. I gave the note to A. Horton - scheduler to schedule for before 10:00am tomorrow.

## 2017-02-21 NOTE — Telephone Encounter (Signed)
"  I'm scheduled to have surgery with Dr. Paulla Dolly on Tuesday, November 6 which I told him I may have to hold off on.  I want to try to do it the first of the year because I have so many claims out there right now that my insurance hasn't paid off.  I have a health savings plan that doesn't pay a lot on them.  I need to see what they are going to pay before I do the surgery because I'm going to owe on that surgery.  There's three people out on my work which makes it worse.  If we can, set it up for January 8.  Get back to me and let me know.  If you get my voicemail leave me a voicemail letting me know if that date is good."

## 2017-02-22 NOTE — Telephone Encounter (Signed)
I left her a message that I will get her surgery rescheduled from March 14, 2017 to May 16, 2017.  I asked her to give me a call if she has any questions.

## 2017-02-27 ENCOUNTER — Ambulatory Visit (INDEPENDENT_AMBULATORY_CARE_PROVIDER_SITE_OTHER): Payer: BLUE CROSS/BLUE SHIELD | Admitting: Physician Assistant

## 2017-02-27 ENCOUNTER — Telehealth: Payer: Self-pay | Admitting: *Deleted

## 2017-02-27 NOTE — Telephone Encounter (Signed)
"  I'd like to reschedule my surgery.  I'm scheduled for January 8.  I promise this will be the last time.  The reason I want to move it up is because my foot is killing me.  I also want to move it up because of my insurance."  Do you have a date in mind that you would like to reschedule it to?  "I'd like to do it on December 11.  Is it available?"  Yes, I will get it rescheduled to December 11.  "Can I speak to someone in insurance?"  She's not available at this time.  I can send you to her voicemail so you can leave her a message.  "That will be fine."  The call was transferred to [facility] in the insurance department.

## 2017-03-02 ENCOUNTER — Telehealth: Payer: Self-pay | Admitting: Podiatry

## 2017-03-03 MED ORDER — DOXYCYCLINE HYCLATE 100 MG PO CAPS: 100.0000 mg | ORAL_CAPSULE | Freq: Two times a day (BID) | ORAL | 0 refills | Status: DC

## 2017-03-03 NOTE — Addendum Note (Signed)
Addended by: Harriett Sine D on: 03/03/2017 10:32 AM   Modules accepted: Orders

## 2017-03-03 NOTE — Telephone Encounter (Addendum)
Left message informing pt that Dr. Paulla Dolly would like to see her in office prior to writing an antibiotic, and our schedulers had tried to contact her just before 5:00pm yesterday to schedule an appt today before 10:00am. I spoke with Dr. Paulla Dolly due to not being able to contact pt for an appt this morning. Dr. Paulla Dolly ordered Doxycycline 100mg  capsules #14 one tablet bid and pt needs an appt to be seen before completing the antibiotic. Left message informing pt, that I had spoken to Dr. Paulla Dolly and the antibiotic had been sent to the [facility] [address] and to keep her Monday appt.

## 2017-03-03 NOTE — Telephone Encounter (Signed)
I called and left a message for the nurse this morning between 9 - 9:30 am. I think I have an bone infection in my second left toe. I'm actually scheduled to have surgery on 11 December. I needed to see if an antibiotic could be called in for me or do I need to come in and have someone look at it for me. Please call me back at [phone] and let me know. Thank you.

## 2017-03-06 ENCOUNTER — Ambulatory Visit (INDEPENDENT_AMBULATORY_CARE_PROVIDER_SITE_OTHER): Payer: BLUE CROSS/BLUE SHIELD | Admitting: Podiatry

## 2017-03-06 DIAGNOSIS — M2012 Hallux valgus (acquired), left foot: Secondary | ICD-10-CM

## 2017-03-06 DIAGNOSIS — M2042 Other hammer toe(s) (acquired), left foot: Secondary | ICD-10-CM

## 2017-03-08 ENCOUNTER — Ambulatory Visit (HOSPITAL_COMMUNITY)
Admission: EM | Admit: 2017-03-08 | Discharge: 2017-03-08 | Disposition: A | Discharge status: Home / Self Care | Payer: BLUE CROSS/BLUE SHIELD | Attending: Family Medicine | Admitting: Family Medicine

## 2017-03-08 ENCOUNTER — Encounter (HOSPITAL_COMMUNITY): Payer: Self-pay | Admitting: Emergency Medicine

## 2017-03-08 DIAGNOSIS — L539 Erythematous condition, unspecified: Secondary | ICD-10-CM

## 2017-03-08 DIAGNOSIS — S91105A Unspecified open wound of left lesser toe(s) without damage to nail, initial encounter: Secondary | ICD-10-CM

## 2017-03-08 DIAGNOSIS — L03032 Cellulitis of left toe: Secondary | ICD-10-CM

## 2017-03-08 DIAGNOSIS — Z5189 Encounter for other specified aftercare: Secondary | ICD-10-CM

## 2017-03-08 MED ORDER — MUPIROCIN 2 % EX OINT: 1.0000 "application " | TOPICAL_OINTMENT | Freq: Two times a day (BID) | CUTANEOUS | 0 refills | Status: DC

## 2017-03-08 NOTE — ED Provider Notes (Signed)
[facility]    CSN: [id number] Arrival date & time: 03/08/17  1846     History   Chief Complaint Chief Complaint  Patient presents with  . Wound Check    HPI Carla [name] is a 61 y.o. female.   61 year old female with history of DM, HLD, HTN comes in for wound check. Patient is followed by podiatrist for bunion deformation and is scheduled for surgery in December. She was put on doxycycline 6 days ago for redness/infection of the left 2nd toe. She states she has been taking as directed. There was a blister on left 2nd toe that ruptured 1-2 days ago, which she has been applying hydrogen peroxide and neosporin for. She noticed some increased redness on the dorsal foot and came in for evaluation. Denies increase pain, discharge. Denies fever, chills, night sweats. She is not currently on medication for DM, last a1c 6.1      Past Medical History:  Diagnosis Date  . Allergic rhinitis   . Allergy    rhinitis  . Diabetes mellitus   . Heel spur, right   . Hyperlipidemia   . Hypertension   . Metabolic syndrome   . Osteoarthritis   . Osteopenia   . Plantar fasciitis   . Prediabetes   . Syncope and collapse 09/19/2014  . Type 2 HSV infection of vulvovaginal region   . Vitamin D deficiency     Patient Active Problem List   Diagnosis Date Noted  . Other fatigue 01/12/2017  . Shortness of breath on exertion 01/12/2017  . Essential hypertension 01/12/2017  . Anxiety state 09/23/2014  . Diarrhea 09/23/2014  . Syncope and collapse 09/19/2014  . Dizziness and giddiness 09/19/2014  . Metabolic syndrome 82/70/7867  . Diabetes mellitus ([place]) 12/04/2010  . Allergic rhinitis 12/04/2010  . Obesity 12/04/2010  . Osteoarthritis 12/04/2010  . Hyperlipidemia 12/04/2010  . Vitamin D deficiency 12/04/2010    Past Surgical History:  Procedure Laterality Date  . ABDOMINAL HYSTERECTOMY    . COLONOSCOPY    . JOINT REPLACEMENT     l hip  . phalangectomy     rt 2nd toe    . TOTAL HIP ARTHROPLASTY     right/02/2011    OB History    Gravida Para Term Preterm AB Living   0 0 0 0 0 0   SAB TAB Ectopic Multiple Live Births   0 0 0 0 0       Home Medications    Prior to Admission medications   Medication Sig Start Date End Date Taking? Authorizing Provider  ALPRAZolam Duanne Moron) 0.5 MG tablet TAKE 1/2-1 TABLET BY MOUTH TWICE DAILY AS NEEDED FOR ANXIETY 12/26/16   Elby Showers, MD  atorvastatin (LIPITOR) 20 MG tablet TAKE 1 TABLET BY MOUTH DAILY 12/31/16   Elby Showers, MD  cetirizine (ZYRTEC) 10 MG tablet Take 10 mg by mouth daily.    [provider]  cyclobenzaprine (FLEXERIL) 10 MG tablet Take 1 tablet (10 mg total) by mouth 3 (three) times daily as needed for muscle spasms. 05/25/15   Elby Showers, MD  doxycycline (VIBRAMYCIN) 100 MG capsule Take 1 capsule (100 mg total) by mouth 2 (two) times daily. 03/03/17   Wallene Huh, DPM  Glycopyrrolate-Formoterol (BEVESPI AEROSPHERE) 9-4.8 MCG/ACT AERO Inhale 9 mcg into the lungs as directed. 04/07/16   Elby Showers, MD  montelukast (SINGULAIR) 10 MG tablet TAKE 1 TABLET BY MOUTH DAILY AT BEDTIME 10/31/16  Elby Showers, MD  Multiple Vitamins-Minerals (MULTIVITAMIN WITH MINERALS) tablet Take 1 tablet by mouth daily.    [provider]  mupirocin ointment (BACTROBAN) 2 % Apply 1 application topically 2 (two) times daily. 03/08/17   Tasia Catchings, Shaunta Oncale V, PA-C  polyethylene glycol powder (GLYCOLAX/MIRALAX) powder Take 17 g by mouth daily. 02/06/17   Waldon Merl, PA-C  predniSONE (DELTASONE) 10 MG tablet Take in tapering course as directed 6 5 4 3 2 1  02/01/17   Elby Showers, MD  ramipril (ALTACE) 5 MG capsule TAKE 1 CAPSULE BY MOUTH TWICE DAILY 09/30/16   Elby Showers, MD  Vitamin D, Ergocalciferol, (DRISDOL) 50000 units CAPS capsule Take 1 capsule (50,000 Units total) by mouth every 7 (seven) days. 01/25/17   Starlyn Skeans, MD    Family History Family History  Problem Relation Age of Onset   . Hypertension Mother   . Osteoporosis Mother   . Parkinson's disease Mother   . Cancer Father   . Mental illness Father   . Parkinson's disease Father   . Diabetes Brother   . Heart disease Maternal Grandfather   . Breast cancer Neg Hx     Social History Social History  Substance Use Topics  . Smoking status: Never Smoker  . Smokeless tobacco: Never Used  . Alcohol use 0.0 oz/week     Comment: occasional wine     Allergies   Cephalexin; Codeine; Dust mite extract; and Mold extract [trichophyton]   Review of Systems Review of Systems  Reason unable to perform ROS: See HPI as above.     Physical Exam Triage Vital Signs ED Triage Vitals  Enc Vitals Group     BP 03/08/17 1902 (!) 144/81     Pulse Rate 03/08/17 1902 82     Resp 03/08/17 1902 18     Temp 03/08/17 1902 98 F (36.7 C)     Temp Source 03/08/17 1902 Oral     SpO2 03/08/17 1902 95 %     Weight --      Height --      Head Circumference --      Peak Flow --      Pain Score 03/08/17 1903 8     Pain Loc --      Pain Edu? --      Excl. in [place]? --    No data found.   Updated Vital Signs BP (!) 144/81 (BP Location: Right Arm)   Pulse 82   Temp 98 F (36.7 C) (Oral)   Resp 18   SpO2 95%   Physical Exam  Constitutional: She is oriented to person, place, and time. She appears well-developed and well-nourished. No distress.  HENT:  Head: Normocephalic and atraumatic.  Eyes: Pupils are equal, round, and reactive to light. Conjunctivae are normal.  Musculoskeletal:  See picture below. Erythema around open wound on the left 2nd toe. Mild increase in warmth. Decreased ROM of toe, which is normal for patient. NVI.   Neurological: She is alert and oriented to person, place, and time.        UC Treatments / Results  Labs (all labs ordered are listed, but only abnormal results are displayed) Labs Reviewed - No data to display  EKG  EKG Interpretation None       Radiology No results  found.  Procedures Procedures (including critical care time)  Medications Ordered in UC Medications - No data to display   Initial Impression / Assessment and  Plan / UC Course  I have reviewed the triage vital signs and the nursing notes.  Pertinent labs & imaging results that were available during my care of the patient were reviewed by me and considered in my medical decision making (see chart for details).    Discussed possible irritation from H2O2 and neosporin as no significant increase in warmth on exam. Will discontinue H2O2 and neosporin, apply bactroban as directed. Continue doxycycline as directed. Follow up with podiatry for further evaluation and treatment needed. Return precautions given.  Case discussed with attending, Dr Mannie Stabile, who agrees to plan.   Final Clinical Impressions(s) / UC Diagnoses   Final diagnoses:  Visit for wound check    New Prescriptions Discharge Medication List as of 03/08/2017  7:17 PM    START taking these medications   Details  mupirocin ointment (BACTROBAN) 2 % Apply 1 application topically 2 (two) times daily., Starting Wed 03/08/2017, Normal          Ok Edwards, [place] 03/08/17 1928

## 2017-03-08 NOTE — ED Triage Notes (Signed)
Pt here for wound eval to right foot; pt sts taking doxy x 6 days and redness is progressing

## 2017-03-08 NOTE — Progress Notes (Signed)
Subjective:    Patient ID: Carla [name], female   DOB: 61 y.o.   MRN: [id number]   HPI patient presents stating that she's had redness in her right second digit and she is due for surgery in December and states that it's very tender when pressed and that she has tried padding    ROS      Objective:  Physical Exam neurovascular status intact with structural bunion deformity left elevated second toe with redness on top of the toe localized in nature with no proximal edema erythema or drainage noted     Assessment:    Localized irritation secondary digit left secondary to position of the toe with chronic irritation again shoe gear     Plan:    H&P conditions reviewed and discussed surgery to correct the underlying hammertoe and structural bunion deformity. Patient wants surgery but were holding off until December and I want her in open toed shoes currently and soaks and she will continue doxycycline is given strict instructions to come in if any other issues should occur

## 2017-03-08 NOTE — Discharge Instructions (Signed)
Symptoms could be due to irritation from peroxide and neosporin, please discontinue them. Gently clean wound with soap and water. Keep wound clean and dry. You can apply Bactroban ointment as directed. Continue doxycyclien as directed. Monitor for worsening symptoms, spreading redness, increased warmth, fever, follow up with podiatrist for further evaluation.

## 2017-03-13 ENCOUNTER — Telehealth: Payer: Self-pay | Admitting: *Deleted

## 2017-03-13 NOTE — Telephone Encounter (Signed)
Pt states she ha taken the antibiotic and was told to call if worsened, redness is over the bottom and top with swelling. I left message requesting pt make an appt to be seen either Tuesday or Wednesday and to continue the antibiotic.

## 2017-03-15 ENCOUNTER — Ambulatory Visit: Payer: BLUE CROSS/BLUE SHIELD | Admitting: Podiatry

## 2017-03-17 ENCOUNTER — Other Ambulatory Visit: Payer: Self-pay | Admitting: Podiatry

## 2017-03-17 ENCOUNTER — Encounter: Payer: Self-pay | Admitting: Podiatry

## 2017-03-17 ENCOUNTER — Ambulatory Visit (INDEPENDENT_AMBULATORY_CARE_PROVIDER_SITE_OTHER): Payer: BLUE CROSS/BLUE SHIELD

## 2017-03-17 ENCOUNTER — Ambulatory Visit (INDEPENDENT_AMBULATORY_CARE_PROVIDER_SITE_OTHER): Payer: BLUE CROSS/BLUE SHIELD | Admitting: Podiatry

## 2017-03-17 DIAGNOSIS — L02612 Cutaneous abscess of left foot: Secondary | ICD-10-CM

## 2017-03-17 DIAGNOSIS — L03032 Cellulitis of left toe: Secondary | ICD-10-CM | POA: Diagnosis not present

## 2017-03-17 DIAGNOSIS — M79675 Pain in left toe(s): Secondary | ICD-10-CM

## 2017-03-17 MED ORDER — CIPROFLOXACIN HCL 500 MG PO TABS: 500.0000 mg | ORAL_TABLET | Freq: Two times a day (BID) | ORAL | 0 refills | Status: DC

## 2017-03-17 MED ORDER — AMOXICILLIN-POT CLAVULANATE 875-125 MG PO TABS: 1.0000 | ORAL_TABLET | Freq: Two times a day (BID) | ORAL | 0 refills | Status: DC

## 2017-03-20 NOTE — Progress Notes (Signed)
Subjective:    Patient ID: Carla [name], female   DOB: 61 y.o.   MRN: [id number]   HPI patient presents stating she still having a lot of problems second toe left and it's been red and she is due for surgery in about 3 weeks. Has been using ointment on it twice a day and it seems to help a little bit and has been wearing open toed shoes    ROS      Objective:  Physical Exam neurovascular status is intact with good digital perfusion and is noted to have continued redness of the dorsal digit 2 left with keratotic tissue formation and keratotic tissue on the inside of the toe. There is slight redness more proximal and patient has no history of fever or chills currently     Assessment:    Appears to be localized abscess formation or cellulitis of the left second digit with tissue formation     Plan:    H&P x-rays taken reviewed and today I went ahead and did a proximal nerve block of the second MPJ. Then using sharp sterile his mentation debrided tissue and create an opening and did a culture of this to try to understand what bacteria may be part of this and placed patient on both Augmentin and Cipro and will adjust if necessary  X-ray indicates that there is no indication currently of ostial lysis

## 2017-03-22 ENCOUNTER — Other Ambulatory Visit: Payer: Self-pay | Admitting: Internal Medicine

## 2017-03-28 ENCOUNTER — Telehealth: Payer: Self-pay | Admitting: Podiatry

## 2017-03-28 NOTE — Telephone Encounter (Signed)
Left message instructing pt to make an appt with Dr. Paulla Dolly.

## 2017-03-28 NOTE — Telephone Encounter (Signed)
Left vm for pt to call to schedule appt

## 2017-03-28 NOTE — Telephone Encounter (Signed)
I'm a pt of Dr. Mellody Drown and I'm scheduled to have surgery on 11 December. I've had an infection and was on medication which I have finished but I feel like I still have an infection of the 2nd left toe. I have been on three different antibiotics. Its still red, still oozing pus as well. Do I need to push my surgery back until this is cleared, do I need an appointment with Dr. Paulla Dolly, or do I need to take more antibiotics. Dr. Paulla Dolly did a culture about 10 days ago and no one has called with those results. If someone could please call me back at [phone]. Thank you.

## 2017-03-29 ENCOUNTER — Telehealth: Payer: Self-pay | Admitting: *Deleted

## 2017-03-29 ENCOUNTER — Encounter: Payer: Self-pay | Admitting: Podiatry

## 2017-03-29 ENCOUNTER — Ambulatory Visit (INDEPENDENT_AMBULATORY_CARE_PROVIDER_SITE_OTHER): Payer: BLUE CROSS/BLUE SHIELD | Admitting: Podiatry

## 2017-03-29 DIAGNOSIS — L03032 Cellulitis of left toe: Secondary | ICD-10-CM | POA: Diagnosis not present

## 2017-03-29 DIAGNOSIS — L02612 Cutaneous abscess of left foot: Secondary | ICD-10-CM

## 2017-03-29 DIAGNOSIS — M2012 Hallux valgus (acquired), left foot: Secondary | ICD-10-CM | POA: Diagnosis not present

## 2017-03-29 DIAGNOSIS — M2042 Other hammer toe(s) (acquired), left foot: Secondary | ICD-10-CM | POA: Diagnosis not present

## 2017-03-29 MED ORDER — DELAFLOXACIN MEGLUMINE 450 MG PO TABS: 1.0000 | ORAL_TABLET | Freq: Every day | ORAL | 0 refills | Status: DC

## 2017-03-29 NOTE — Progress Notes (Signed)
Subjective:    Patient ID: Carla [name], female   DOB: 61 y.o.   MRN: [id number]   HPI patient presents stating the second toe is feeling some better but there still some redness and we wanted to review the cultures    ROS      Objective:  Physical Exam neurovascular status intact with patient found to have diminished redness dorsum left foot with the second digit still showing a crusted area on top of the toe with no active drainage noted. Patient still has quite a bit of irritation around this area with no current systemic signs of infection     Assessment:    Patient do for surgery in December with continued irritation around the second digit left and structural bunion deformity left     Plan:    I reviewed this case with Dr. Jacqualyn Posey were in agreement that it appears her some of an infective nature second digit even though cultures so far have been negative. At this point we're placing patient on baxdela to hopefully eradicate remaining bacteria and also I went ahead and ordered blood work consisting of CBC with differential along with sedimentation rate and I do want to get an A1c on her. Patient will be seen back in 2 weeks or earlier if needed

## 2017-03-29 NOTE — Telephone Encounter (Signed)
Dr. Paulla Dolly ordered Carla [name].

## 2017-04-04 LAB — CBC WITH DIFFERENTIAL/PLATELET
BASOS ABS: 81 {cells}/uL (ref 0–200)
BASOS PCT: 1 %
EOS PCT: 3 %
Eosinophils Absolute: 243 cells/uL (ref 15–500)
HEMATOCRIT: 40 % (ref 35.0–45.0)
HEMOGLOBIN: 12.8 g/dL (ref 11.7–15.5)
LYMPHS ABS: 2292 {cells}/uL (ref 850–3900)
MCH: 29.4 pg (ref 27.0–33.0)
MCHC: 32 g/dL (ref 32.0–36.0)
MCV: 92 fL (ref 80.0–100.0)
MONOS PCT: 9.8 %
MPV: 10.7 fL (ref 7.5–12.5)
NEUTROS ABS: 4690 {cells}/uL (ref 1500–7800)
Neutrophils Relative %: 57.9 %
Platelets: 325 10*3/uL (ref 140–400)
RBC: 4.35 10*6/uL (ref 3.80–5.10)
RDW: 13 % (ref 11.0–15.0)
Total Lymphocyte: 28.3 %
WBC mixed population: 794 cells/uL (ref 200–950)
WBC: 8.1 10*3/uL (ref 3.8–10.8)

## 2017-04-04 LAB — HEMOGLOBIN A1C
EAG (MMOL/L): 7.1 (calc)
HEMOGLOBIN A1C: 6.1 %{Hb} — AB (ref <5.7)
Mean Plasma Glucose: 128 (calc)

## 2017-04-04 LAB — SEDIMENTATION RATE: SED RATE: 6 mm/h (ref 0–30)

## 2017-04-13 ENCOUNTER — Encounter: Payer: Self-pay | Admitting: Podiatry

## 2017-04-13 ENCOUNTER — Ambulatory Visit (INDEPENDENT_AMBULATORY_CARE_PROVIDER_SITE_OTHER): Payer: BLUE CROSS/BLUE SHIELD | Admitting: Podiatry

## 2017-04-13 DIAGNOSIS — L03032 Cellulitis of left toe: Secondary | ICD-10-CM

## 2017-04-13 DIAGNOSIS — M2042 Other hammer toe(s) (acquired), left foot: Secondary | ICD-10-CM

## 2017-04-13 DIAGNOSIS — L02612 Cutaneous abscess of left foot: Secondary | ICD-10-CM

## 2017-04-13 MED ORDER — CEPHALEXIN 500 MG PO CAPS: 500.0000 mg | ORAL_CAPSULE | Freq: Three times a day (TID) | ORAL | 1 refills | Status: DC

## 2017-04-13 MED ORDER — CIPROFLOXACIN HCL 500 MG PO TABS: 500.0000 mg | ORAL_TABLET | Freq: Two times a day (BID) | ORAL | 0 refills | Status: DC

## 2017-04-13 NOTE — Progress Notes (Signed)
Subjective:   Patient ID: Carla [name], female   DOB: 61 y.o.   MRN: [id number]   HPI Patient presents stating I seem to be doing okay but I still have a lot of irritation on the second toe my left foot.  Patient is due for surgery in 5 days.   ROS      Objective:  Physical Exam  Neurovascular status intact patient's blood work was normal with normal WBC sed rate and A1c.  Patient second toe left still has a keratotic lesion on top of the digit with low-grade redness around the area no active drainage and no proximal edema erythema or drainage noted.     Assessment:  Patient has had a consistent irritation of the dorsal second digit left with an absence of other findings except for this localized area and does have structural bunion deformity noted left.     Plan:  Reviewed condition and had Dr. March Rummage look at this patient with me today.  Were both in agreement that this is a local process and that the bone does need to be removed no matter what and that arthroplasty with culture should be satisfactory.  Also structural bunion will be done at the same time to reduce the pressure against the second toe and reduce the pressure on the first metatarsal.  I reviewed with her at great length that there is no guarantee that she does not have further infection in this toe and that it may ultimately require IV antibiotics for possible digital amputation.  Patient understands all of this is willing to accept risks and we reviewed again consent form with this patient

## 2017-04-13 NOTE — Addendum Note (Signed)
Addended by: Roney Jaffe on: 04/13/2017 04:44 PM   Modules accepted: Orders

## 2017-04-13 NOTE — Patient Instructions (Signed)
Pre-Operative Instructions  Congratulations, you have decided to take an important step towards improving your quality of life.  You can be assured that the doctors and staff at [facility] will be with you every step of the way.  Here are some important things you should know:  1. Plan to be at the surgery center/hospital at least 1 (one) hour prior to your scheduled time, unless otherwise directed by the surgical center/hospital staff.  You must have a responsible adult accompany you, remain during the surgery and drive you home.  Make sure you have directions to the surgical center/hospital to ensure you arrive on time. 2. If you are having surgery at [facility] or [facility], you will need a copy of your medical history and physical form from your family physician within one month prior to the date of surgery. We will give you a form for your primary physician to complete.  3. We make every effort to accommodate the date you request for surgery.  However, there are times where surgery dates or times have to be moved.  We will contact you as soon as possible if a change in schedule is required.   4. No aspirin/ibuprofen for one week before surgery.  If you are on aspirin, any non-steroidal anti-inflammatory medications (Mobic, Aleve, Ibuprofen) should not be taken seven (7) days prior to your surgery.  You make take Tylenol for pain prior to surgery.  5. Medications - If you are taking daily heart and blood pressure medications, seizure, reflux, allergy, asthma, anxiety, pain or diabetes medications, make sure you notify the surgery center/hospital before the day of surgery so they can tell you which medications you should take or avoid the day of surgery. 6. No food or drink after midnight the night before surgery unless directed otherwise by surgical center/hospital staff. 7. No alcoholic beverages 24-hours prior to surgery.  No smoking 24-hours prior or 24-hours after  surgery. 8. Wear loose pants or shorts. They should be loose enough to fit over bandages, boots, and casts. 9. Don't wear slip-on shoes. Sneakers are preferred. 10. Bring your boot with you to the surgery center/hospital.  Also bring crutches or a walker if your physician has prescribed it for you.  If you do not have this equipment, it will be provided for you after surgery. 11. If you have not been contacted by the surgery center/hospital by the day before your surgery, call to confirm the date and time of your surgery. 12. Leave-time from work may vary depending on the type of surgery you have.  Appropriate arrangements should be made prior to surgery with your employer. 13. Prescriptions will be provided immediately following surgery by your doctor.  Fill these as soon as possible after surgery and take the medication as directed. Pain medications will not be refilled on weekends and must be approved by the doctor. 14. Remove nail polish on the operative foot and avoid getting pedicures prior to surgery. 15. Wash the night before surgery.  The night before surgery wash the foot and leg well with water and the antibacterial soap provided. Be sure to pay special attention to beneath the toenails and in between the toes.  Wash for at least three (3) minutes. Rinse thoroughly with water and dry well with a towel.  Perform this wash unless told not to do so by your physician.  Enclosed: 1 Ice pack (please put in freezer the night before surgery)   1 Hibiclens skin cleaner     Pre-op instructions  If you have any questions regarding the instructions, please do not hesitate to call our office.  [place]: [address], , [place] [address] -- [phone]  [place]: [address] [place], [place] [address] -- [phone]  [place]: [address]  , [place] [address] -- [phone]  [place]: [address], [address], [place], [place] [address] -- [phone]  Website:  [email or web address] 

## 2017-04-14 ENCOUNTER — Other Ambulatory Visit: Payer: Self-pay

## 2017-04-14 ENCOUNTER — Telehealth: Payer: Self-pay | Admitting: Podiatry

## 2017-04-14 MED ORDER — CIPROFLOXACIN HCL 500 MG PO TABS: 500.0000 mg | ORAL_TABLET | Freq: Two times a day (BID) | ORAL | 0 refills | Status: DC

## 2017-04-14 NOTE — Telephone Encounter (Signed)
I was in the office and saw Dr. Paulla Dolly yesterday. He was supposed to e-scribe 2 antibiotic prescriptions for me as I'm scheduled for surgery next Tuesday. I use the [facility] on [address]. I called them yesterday and they only got one. I know he was trying to send both when I left. The one they did not get was for the Cipro but they received the other one. I'm trying to get my husband to get my prescriptions sometime this afternoon while I'm working. So I was wanting to know if you could get that taken care of this morning, that would be great. If you could actually call me back and let me know that its been taken care of. My number is [phone] and if you have to leave a voicemail that's okay as I'm at work. Thank you.

## 2017-04-14 NOTE — Telephone Encounter (Signed)
This has been taken care of.

## 2017-04-18 ENCOUNTER — Telehealth: Payer: Self-pay | Admitting: *Deleted

## 2017-04-18 ENCOUNTER — Encounter: Payer: Self-pay | Admitting: Podiatry

## 2017-04-18 NOTE — Telephone Encounter (Signed)
I left patient a message that we are rescheduling her appointment to December 18.  I asked her to call if this date is not good for her.

## 2017-04-18 NOTE — Telephone Encounter (Signed)
"  I was supposed to have surgery this morning at 7:30 am with Dr. Paulla Dolly and I had called him over the weekend.  He gave me his cell phone number.  I had to cancel it because my husband had a mild stroke.  He had to go into the hospital on Friday night.  He's still in the hospital but he's going home today.  So, we talked about trying to reschedule this by the end of the year.  So, I don't know if he has an opening on the 18 or if he can squeeze me in.  I know he only does surgeries on Tuesdays but he did tell me that he'd be glad to go in another day and do it sometime before the end of the year.  You all just need to let me know when.  We wanted to do it before the end of the year because I've met my deductible.  So I really need to do it that way.  Give me a call back as soon as you can.  Maybe we can figure something out so I'll know what to do with my work schedule."

## 2017-04-20 DIAGNOSIS — M2012 Hallux valgus (acquired), left foot: Secondary | ICD-10-CM | POA: Diagnosis not present

## 2017-04-25 DIAGNOSIS — M2012 Hallux valgus (acquired), left foot: Secondary | ICD-10-CM | POA: Diagnosis not present

## 2017-04-25 DIAGNOSIS — M2042 Other hammer toe(s) (acquired), left foot: Secondary | ICD-10-CM | POA: Diagnosis not present

## 2017-04-26 ENCOUNTER — Other Ambulatory Visit: Payer: BLUE CROSS/BLUE SHIELD

## 2017-04-28 ENCOUNTER — Encounter: Payer: Self-pay | Admitting: Podiatry

## 2017-05-03 ENCOUNTER — Ambulatory Visit (INDEPENDENT_AMBULATORY_CARE_PROVIDER_SITE_OTHER): Payer: BLUE CROSS/BLUE SHIELD

## 2017-05-03 ENCOUNTER — Ambulatory Visit (INDEPENDENT_AMBULATORY_CARE_PROVIDER_SITE_OTHER): Payer: BLUE CROSS/BLUE SHIELD | Admitting: Podiatry

## 2017-05-03 DIAGNOSIS — M2012 Hallux valgus (acquired), left foot: Secondary | ICD-10-CM | POA: Diagnosis not present

## 2017-05-03 DIAGNOSIS — M2042 Other hammer toe(s) (acquired), left foot: Secondary | ICD-10-CM

## 2017-05-03 DIAGNOSIS — Z9889 Other specified postprocedural states: Secondary | ICD-10-CM

## 2017-05-04 NOTE — Progress Notes (Signed)
  Subjective:  Patient ID: Carla [name], female    DOB: 1956/02/16,  MRN: [id number]  DOS: 04/25/17 Procedure: [place] Bunionectomy L, Hammertoe Repair L 2nd toe by Dr. Paulla Dolly  61 y.o. female returns for post-op check. Denies N/V/F/Ch. Pain is controlled with current medications.  Objective:   General AA&O x3. Normal mood and affect.  Vascular Foot warm and well perfused.  Neurologic Gross sensation intact.  Dermatologic Skin healing well without signs of infection. Skin edges well coapted with intact suture.  Orthopedic: Tenderness to palpation noted about the surgical site.   Radiographs: Taken and reviewed.  L foot. Reduced HAV deformity with intact pin fixation.  Evidence of second toe arthroplasty with slight lateral angulation of the digit. Assessment & Plan:  Patient was evaluated and treated and all questions answered.  S/p Austin Bunionectomy L, Hammertoe Repair L 2nd toe  -Progressing as expected post-operatively. -Sutures: left intact. -Medications refilled: none -Foot redressed.  F/u in 1 week for suture removal L 2nd toe.

## 2017-05-05 ENCOUNTER — Ambulatory Visit (INDEPENDENT_AMBULATORY_CARE_PROVIDER_SITE_OTHER): Payer: BLUE CROSS/BLUE SHIELD | Admitting: Internal Medicine

## 2017-05-05 ENCOUNTER — Encounter: Payer: Self-pay | Admitting: Internal Medicine

## 2017-05-05 VITALS — BP 110/70 | HR 81 | Ht 61.0 in

## 2017-05-05 DIAGNOSIS — F439 Reaction to severe stress, unspecified: Secondary | ICD-10-CM

## 2017-05-05 DIAGNOSIS — F419 Anxiety disorder, unspecified: Secondary | ICD-10-CM | POA: Diagnosis not present

## 2017-05-05 DIAGNOSIS — F329 Major depressive disorder, single episode, unspecified: Secondary | ICD-10-CM

## 2017-05-05 DIAGNOSIS — F32A Depression, unspecified: Secondary | ICD-10-CM

## 2017-05-05 MED ORDER — BUPROPION HCL ER (XL) 150 MG PO TB24: 150.0000 mg | ORAL_TABLET | Freq: Every day | ORAL | 1 refills | Status: DC

## 2017-05-05 MED ORDER — ALPRAZOLAM 0.5 MG PO TABS: ORAL_TABLET | ORAL | 0 refills | Status: DC

## 2017-05-05 NOTE — Patient Instructions (Addendum)
Begin Wellbutrin XL 150 mg daily.  Xanax 0.5 mg twice daily for anxiety.  Consider counseling.  Return in 6-8 weeks or at least call with progress report.

## 2017-05-05 NOTE — Progress Notes (Signed)
Subjective:     Patient ID: Carla [name], female   DOB: Jun 04, 1955, 61 y.o.   MRN: [id number]  HPI Her husband recently had a stroke.  He does not take care of himself very well.  He is a poorly controlled diabetic and eats junk food.  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  Sometimes he seems to resent that.  He has children from a previous marriage.  She felt guilty when they called an ambulance to take him to the hospital when he had a stroke although he has had a aphasia for well over 24 hours when they called the ambulance.  [place].  She feels like it never accepted her.  They do not give her any help with him.  Her mother and stepfather in their 80s.  They likely need to move into assisted living but they cannot seem to find a place where they want to live.  She and her sister-in-law have been trying to help them but it has not been DC'd.  Talked with her at length about setting limits with relatives and with her husband.  She says that she thinks that she took some time to go with him to exercise it would be of benefit.  However the bottom line is that he tends to be passive aggressive and would buy food behind her back that is not good for diabetes.  He is not motivated to diet and exercise.  She is the only source of income other than his disability income.  She feels that she needs to work another couple of years in order to draw decent retirement.  I am going to place her on Wellbutrin 150 mg XL daily.  She will increase Xanax to twice daily one whole tablet.  Gave her the name of psychologist, Doroteo Glassman, for counseling.   Review of Systems     Objective:   Physical Exam Not examined.  Spent 20 minutes speaking with her about these issues.    Assessment:    Anxiety and depression  Situational stress    Plan:     Xanax 0.5 mg twice daily for anxiety.  Wellbutrin XL 150 mg daily.  Consider counseling.  Follow-up in 6-8  weeks.   She had physical exam in June.  Follow-up with hemoglobin A1c was 6.1% in November.

## 2017-05-10 ENCOUNTER — Encounter: Payer: Self-pay | Admitting: Podiatry

## 2017-05-10 ENCOUNTER — Ambulatory Visit: Payer: BLUE CROSS/BLUE SHIELD

## 2017-05-10 ENCOUNTER — Ambulatory Visit (INDEPENDENT_AMBULATORY_CARE_PROVIDER_SITE_OTHER): Payer: BLUE CROSS/BLUE SHIELD | Admitting: Podiatry

## 2017-05-10 ENCOUNTER — Ambulatory Visit (INDEPENDENT_AMBULATORY_CARE_PROVIDER_SITE_OTHER): Payer: BLUE CROSS/BLUE SHIELD

## 2017-05-10 DIAGNOSIS — M2012 Hallux valgus (acquired), left foot: Secondary | ICD-10-CM | POA: Diagnosis not present

## 2017-05-10 DIAGNOSIS — M2042 Other hammer toe(s) (acquired), left foot: Secondary | ICD-10-CM

## 2017-05-10 NOTE — Progress Notes (Signed)
Subjective:   Patient ID: Carla [name], female   DOB: 62 y.o.   MRN: [id number]   HPI Patient states she is doing well with the left foot and is having minimal discomfort and is able to stand on it at this time   ROS      Objective:  Physical Exam  Neurovascular status intact with patient's left foot healing well with osteotomy doing well second digit in good alignment wound edges well coapted and no indication or redness or drainage     Assessment:  Doing very well post surgery left     Plan:  Reviewed condition and at this time sterile dressings were reapplied and instructed on continued elevation compression immobilization and reappoint 1 week for suture removal or earlier if needed  X-rays indicate osteotomy is healing well second digit healing well with no indication of pathology

## 2017-05-17 ENCOUNTER — Ambulatory Visit (INDEPENDENT_AMBULATORY_CARE_PROVIDER_SITE_OTHER): Payer: BLUE CROSS/BLUE SHIELD | Admitting: Podiatry

## 2017-05-17 ENCOUNTER — Ambulatory Visit: Payer: BLUE CROSS/BLUE SHIELD

## 2017-05-17 ENCOUNTER — Encounter: Payer: Self-pay | Admitting: Podiatry

## 2017-05-17 DIAGNOSIS — M2042 Other hammer toe(s) (acquired), left foot: Secondary | ICD-10-CM

## 2017-05-17 DIAGNOSIS — M2012 Hallux valgus (acquired), left foot: Secondary | ICD-10-CM

## 2017-05-17 NOTE — Progress Notes (Signed)
Subjective:   Patient ID: Carla [name], female   DOB: 62 y.o.   MRN: [id number]   HPI Patient states overall doing very well with the left foot with minimal discomfort tissue crusted over and no drainage   ROS      Objective:  Physical Exam  Neurovascular status intact negative Homans sign was noted with patient second digit left showing dorsal crust formation but no drainage with minimal swelling in the first metatarsal site healing excellent with wound edges well coapted     Assessment:  Doing well post structural bunion correction hammertoe repair second left with no indication of current infection     Plan:  H&P condition reviewed and recommended continuation of conservative care consisting of open toed shoes and elevation as needed.  Patient will be seen back 4 weeks or earlier if any issues should occur and no longer is on antibiotics  X-rays indicate that there is good healing of the osteotomy with no signs of current pathology

## 2017-06-09 NOTE — Progress Notes (Signed)
DOS 12.18.18 Austin bunionectomy w pin Lt foot, arthroplasty w removal of bone 2nd Lt

## 2017-06-14 ENCOUNTER — Ambulatory Visit (INDEPENDENT_AMBULATORY_CARE_PROVIDER_SITE_OTHER): Payer: BLUE CROSS/BLUE SHIELD | Admitting: Podiatry

## 2017-06-14 ENCOUNTER — Encounter: Payer: Self-pay | Admitting: Podiatry

## 2017-06-14 ENCOUNTER — Ambulatory Visit (INDEPENDENT_AMBULATORY_CARE_PROVIDER_SITE_OTHER): Payer: BLUE CROSS/BLUE SHIELD

## 2017-06-14 DIAGNOSIS — M2042 Other hammer toe(s) (acquired), left foot: Secondary | ICD-10-CM

## 2017-06-14 DIAGNOSIS — M21619 Bunion of unspecified foot: Secondary | ICD-10-CM

## 2017-06-14 NOTE — Progress Notes (Signed)
Subjective:   Patient ID: Carla [name], female   DOB: 62 y.o.   MRN: [id number]   HPI Patient states doing pretty well with her foot with minimal discomfort with crusted-like formation on second toe but overall satisfied   ROS      Objective:  Physical Exam  Neurovascular status intact negative Homans sign noted with patient's left foot healing well with wound edges well coapted and crusted tissue on the second toe left that is localized in nature     Assessment:  Doing well post osteotomy hammertoe repair second left     Plan:  X-ray taken reviewed and I cleaned of crusted tissue second toe and I advised on bandage usage and continued open toed shoes for the next couple weeks.  Continue range of motion exercises and reappoint 6 weeks or earlier if needed  X-rays indicate that there is good healing of the osteotomy with pins in place no signs of movement or other pathology

## 2017-07-01 ENCOUNTER — Other Ambulatory Visit: Payer: Self-pay | Admitting: Internal Medicine

## 2017-07-04 ENCOUNTER — Telehealth: Payer: Self-pay

## 2017-07-04 NOTE — Telephone Encounter (Signed)
Patient states she is going for a teeth cleaning in April and before her dentist had her pre med but this time he recommended to check with you if she needs to take anything before dental work? She had a hip replacement in 1999 and one in 2012. Cb# [phone]

## 2017-07-04 NOTE — Telephone Encounter (Signed)
She can call orthopedist. It is my understanding this is not needed.

## 2017-07-06 NOTE — Telephone Encounter (Signed)
Left message for patient to contact her orthopedist and inquire as to whether she actually needs any medication for the dental procedure.  Per Dr. Renold Genta, she doesn't feel she needs anything any longer.  Patient advised to contact us back if she has any further questions.

## 2017-07-09 ENCOUNTER — Other Ambulatory Visit: Payer: Self-pay | Admitting: Internal Medicine

## 2017-07-10 NOTE — Telephone Encounter (Signed)
Refill x 6 months 

## 2017-07-24 ENCOUNTER — Ambulatory Visit (INDEPENDENT_AMBULATORY_CARE_PROVIDER_SITE_OTHER): Payer: BLUE CROSS/BLUE SHIELD | Admitting: Podiatry

## 2017-07-24 ENCOUNTER — Encounter: Payer: Self-pay | Admitting: Podiatry

## 2017-07-24 ENCOUNTER — Ambulatory Visit (INDEPENDENT_AMBULATORY_CARE_PROVIDER_SITE_OTHER): Payer: BLUE CROSS/BLUE SHIELD

## 2017-07-24 DIAGNOSIS — M2042 Other hammer toe(s) (acquired), left foot: Secondary | ICD-10-CM

## 2017-07-24 NOTE — Progress Notes (Signed)
Patient'sSubjective:   Patient ID: Carla [name], female   DOB: 62 y.o.   MRN: [id number]   HPI Patient is doing well with swelling at the end of the day but is back at his shoe and has no recurrence of the lesion   ROS      Objective:  Physical Exam  Neurovascular status intact negative Homans sign was noted with patient's left foot healing well with wound edges well coapted hallux in rectus position and good range of motion noted of the joint with no lesion second toe left     Assessment:  Doing well overall with good alignment of the first metatarsal good range of motion mild swelling secondary to work     Plan:  Reviewed continued compression elevation as needed and continue range of motion exercises.  Patient is discharged will be seen back on an as-needed basis  X-ray indicates the osteotomy has healing well and good alignment is noted with second digit not elevated on the lateral view and in good position

## 2017-08-07 ENCOUNTER — Telehealth: Payer: Self-pay | Admitting: Internal Medicine

## 2017-08-07 NOTE — Telephone Encounter (Signed)
Patient called to request new Rx for Xanax.  Explained to her that she doesn't need a new request.  She has 5 refills on her current request.  And, she is able to take 1 whole Xanax on current Rx bid.  So, she'll just call pharmacy and request a refill.

## 2017-08-30 ENCOUNTER — Other Ambulatory Visit: Payer: Self-pay | Admitting: Internal Medicine

## 2017-10-03 ENCOUNTER — Other Ambulatory Visit: Payer: Self-pay | Admitting: Internal Medicine

## 2017-10-10 ENCOUNTER — Other Ambulatory Visit: Payer: Self-pay | Admitting: Internal Medicine

## 2017-12-04 ENCOUNTER — Other Ambulatory Visit: Payer: Self-pay | Admitting: Internal Medicine

## 2017-12-04 LAB — HM DIABETES EYE EXAM

## 2017-12-05 ENCOUNTER — Other Ambulatory Visit: Payer: Self-pay | Admitting: Internal Medicine

## 2017-12-15 ENCOUNTER — Other Ambulatory Visit: Payer: Self-pay | Admitting: Internal Medicine

## 2017-12-15 DIAGNOSIS — Z1231 Encounter for screening mammogram for malignant neoplasm of breast: Secondary | ICD-10-CM

## 2017-12-21 ENCOUNTER — Other Ambulatory Visit: Payer: Self-pay | Admitting: Internal Medicine

## 2018-01-08 ENCOUNTER — Other Ambulatory Visit: Payer: Self-pay | Admitting: Internal Medicine

## 2018-01-13 ENCOUNTER — Other Ambulatory Visit: Payer: Self-pay | Admitting: Internal Medicine

## 2018-01-15 ENCOUNTER — Ambulatory Visit
Admission: RE | Admit: 2018-01-15 | Discharge: 2018-01-15 | Disposition: A | Discharge status: Home / Self Care | Payer: BLUE CROSS/BLUE SHIELD | Source: Ambulatory Visit | Attending: Internal Medicine | Admitting: Internal Medicine

## 2018-01-15 DIAGNOSIS — Z1231 Encounter for screening mammogram for malignant neoplasm of breast: Secondary | ICD-10-CM

## 2018-01-16 ENCOUNTER — Other Ambulatory Visit: Payer: Self-pay | Admitting: Internal Medicine

## 2018-03-18 ENCOUNTER — Other Ambulatory Visit: Payer: Self-pay | Admitting: Internal Medicine

## 2018-03-23 ENCOUNTER — Other Ambulatory Visit: Payer: Self-pay | Admitting: Internal Medicine

## 2018-03-23 DIAGNOSIS — M1990 Unspecified osteoarthritis, unspecified site: Secondary | ICD-10-CM

## 2018-03-23 DIAGNOSIS — F32A Depression, unspecified: Secondary | ICD-10-CM

## 2018-03-23 DIAGNOSIS — E782 Mixed hyperlipidemia: Secondary | ICD-10-CM

## 2018-03-23 DIAGNOSIS — Z0000 Encounter for general adult medical examination without abnormal findings: Secondary | ICD-10-CM

## 2018-03-23 DIAGNOSIS — F439 Reaction to severe stress, unspecified: Secondary | ICD-10-CM

## 2018-03-23 DIAGNOSIS — E8881 Metabolic syndrome: Secondary | ICD-10-CM

## 2018-03-23 DIAGNOSIS — F419 Anxiety disorder, unspecified: Secondary | ICD-10-CM

## 2018-03-23 DIAGNOSIS — Z6833 Body mass index (BMI) 33.0-33.9, adult: Secondary | ICD-10-CM

## 2018-03-23 DIAGNOSIS — F329 Major depressive disorder, single episode, unspecified: Secondary | ICD-10-CM

## 2018-03-23 DIAGNOSIS — E119 Type 2 diabetes mellitus without complications: Secondary | ICD-10-CM

## 2018-03-26 ENCOUNTER — Other Ambulatory Visit (INDEPENDENT_AMBULATORY_CARE_PROVIDER_SITE_OTHER): Payer: BLUE CROSS/BLUE SHIELD | Admitting: Internal Medicine

## 2018-03-26 DIAGNOSIS — F439 Reaction to severe stress, unspecified: Secondary | ICD-10-CM | POA: Diagnosis not present

## 2018-03-26 DIAGNOSIS — M1990 Unspecified osteoarthritis, unspecified site: Secondary | ICD-10-CM

## 2018-03-26 DIAGNOSIS — E8881 Metabolic syndrome: Secondary | ICD-10-CM

## 2018-03-26 DIAGNOSIS — Z0000 Encounter for general adult medical examination without abnormal findings: Secondary | ICD-10-CM

## 2018-03-26 DIAGNOSIS — E119 Type 2 diabetes mellitus without complications: Secondary | ICD-10-CM

## 2018-03-26 DIAGNOSIS — E782 Mixed hyperlipidemia: Secondary | ICD-10-CM

## 2018-03-26 DIAGNOSIS — F419 Anxiety disorder, unspecified: Secondary | ICD-10-CM | POA: Diagnosis not present

## 2018-03-26 DIAGNOSIS — F32A Depression, unspecified: Secondary | ICD-10-CM

## 2018-03-26 DIAGNOSIS — F329 Major depressive disorder, single episode, unspecified: Secondary | ICD-10-CM

## 2018-03-26 DIAGNOSIS — Z6833 Body mass index (BMI) 33.0-33.9, adult: Secondary | ICD-10-CM

## 2018-03-26 LAB — POCT URINALYSIS DIPSTICK
Appearance: NORMAL
BILIRUBIN UA: NEGATIVE
GLUCOSE UA: NEGATIVE
KETONES UA: NEGATIVE
LEUKOCYTES UA: NEGATIVE
Nitrite, UA: NEGATIVE
Odor: NORMAL
Protein, UA: NEGATIVE
RBC UA: NEGATIVE
SPEC GRAV UA: 1.015 (ref 1.010–1.025)
Urobilinogen, UA: 0.2 E.U./dL
pH, UA: 6.5 (ref 5.0–8.0)

## 2018-03-27 LAB — MICROALBUMIN / CREATININE URINE RATIO
Creatinine, Urine: 22 mg/dL (ref 20–275)
MICROALB/CREAT RATIO: 9 ug/mg{creat} (ref <30)
Microalb, Ur: 0.2 mg/dL

## 2018-03-27 LAB — COMPLETE METABOLIC PANEL WITH GFR
AG RATIO: 1.8 (calc) (ref 1.0–2.5)
ALKALINE PHOSPHATASE (APISO): 99 U/L (ref 33–130)
ALT: 14 U/L (ref 6–29)
AST: 18 U/L (ref 10–35)
Albumin: 4.3 g/dL (ref 3.6–5.1)
BUN: 25 mg/dL (ref 7–25)
CO2: 29 mmol/L (ref 20–32)
Calcium: 9.3 mg/dL (ref 8.6–10.4)
Chloride: 99 mmol/L (ref 98–110)
Creat: 0.89 mg/dL (ref 0.50–0.99)
GFR, EST NON AFRICAN AMERICAN: 68 mL/min/{1.73_m2} (ref >=60)
GFR, Est African American: 79 mL/min/{1.73_m2} (ref >=60)
GLOBULIN: 2.4 g/dL (ref 1.9–3.7)
Glucose, Bld: 92 mg/dL (ref 65–99)
POTASSIUM: 4.2 mmol/L (ref 3.5–5.3)
SODIUM: 138 mmol/L (ref 135–146)
Total Bilirubin: 0.5 mg/dL (ref 0.2–1.2)
Total Protein: 6.7 g/dL (ref 6.1–8.1)

## 2018-03-27 LAB — CBC WITH DIFFERENTIAL/PLATELET
BASOS PCT: 0.8 %
Basophils Absolute: 59 cells/uL (ref 0–200)
EOS ABS: 163 {cells}/uL (ref 15–500)
Eosinophils Relative: 2.2 %
HCT: 38.6 % (ref 35.0–45.0)
Hemoglobin: 12.7 g/dL (ref 11.7–15.5)
Lymphs Abs: 2168 cells/uL (ref 850–3900)
MCH: 29.8 pg (ref 27.0–33.0)
MCHC: 32.9 g/dL (ref 32.0–36.0)
MCV: 90.6 fL (ref 80.0–100.0)
MONOS PCT: 9.2 %
MPV: 10.9 fL (ref 7.5–12.5)
NEUTROS PCT: 58.5 %
Neutro Abs: 4329 cells/uL (ref 1500–7800)
PLATELETS: 312 10*3/uL (ref 140–400)
RBC: 4.26 10*6/uL (ref 3.80–5.10)
RDW: 12.9 % (ref 11.0–15.0)
TOTAL LYMPHOCYTE: 29.3 %
WBC mixed population: 681 cells/uL (ref 200–950)
WBC: 7.4 10*3/uL (ref 3.8–10.8)

## 2018-03-27 LAB — LIPID PANEL
CHOL/HDL RATIO: 2.8 (calc) (ref <5.0)
CHOLESTEROL: 179 mg/dL (ref <200)
HDL: 64 mg/dL (ref >50)
LDL CHOLESTEROL (CALC): 94 mg/dL
Non-HDL Cholesterol (Calc): 115 mg/dL (calc) (ref <130)
Triglycerides: 111 mg/dL (ref <150)

## 2018-03-27 LAB — HEMOGLOBIN A1C
EAG (MMOL/L): 6.8 (calc)
HEMOGLOBIN A1C: 5.9 %{Hb} — AB (ref <5.7)
MEAN PLASMA GLUCOSE: 123 (calc)

## 2018-03-27 LAB — TSH: TSH: 2.19 m[IU]/L (ref 0.40–4.50)

## 2018-04-02 ENCOUNTER — Ambulatory Visit (INDEPENDENT_AMBULATORY_CARE_PROVIDER_SITE_OTHER): Payer: BLUE CROSS/BLUE SHIELD | Admitting: Internal Medicine

## 2018-04-02 ENCOUNTER — Encounter: Payer: Self-pay | Admitting: Internal Medicine

## 2018-04-02 VITALS — BP 140/80 | HR 77 | Ht 61.0 in | Wt 163.0 lb

## 2018-04-02 DIAGNOSIS — E782 Mixed hyperlipidemia: Secondary | ICD-10-CM | POA: Diagnosis not present

## 2018-04-02 DIAGNOSIS — E8881 Metabolic syndrome: Secondary | ICD-10-CM | POA: Diagnosis not present

## 2018-04-02 DIAGNOSIS — A6000 Herpesviral infection of urogenital system, unspecified: Secondary | ICD-10-CM

## 2018-04-02 DIAGNOSIS — F439 Reaction to severe stress, unspecified: Secondary | ICD-10-CM

## 2018-04-02 DIAGNOSIS — M170 Bilateral primary osteoarthritis of knee: Secondary | ICD-10-CM

## 2018-04-02 DIAGNOSIS — Z23 Encounter for immunization: Secondary | ICD-10-CM | POA: Diagnosis not present

## 2018-04-02 DIAGNOSIS — F329 Major depressive disorder, single episode, unspecified: Secondary | ICD-10-CM

## 2018-04-02 DIAGNOSIS — F419 Anxiety disorder, unspecified: Secondary | ICD-10-CM | POA: Diagnosis not present

## 2018-04-02 DIAGNOSIS — B009 Herpesviral infection, unspecified: Secondary | ICD-10-CM

## 2018-04-02 DIAGNOSIS — F32A Depression, unspecified: Secondary | ICD-10-CM

## 2018-04-02 DIAGNOSIS — Z0000 Encounter for general adult medical examination without abnormal findings: Secondary | ICD-10-CM

## 2018-04-02 DIAGNOSIS — Z96643 Presence of artificial hip joint, bilateral: Secondary | ICD-10-CM

## 2018-04-02 DIAGNOSIS — Z6830 Body mass index (BMI) 30.0-30.9, adult: Secondary | ICD-10-CM

## 2018-04-02 DIAGNOSIS — R7302 Impaired glucose tolerance (oral): Secondary | ICD-10-CM

## 2018-04-02 DIAGNOSIS — E6609 Other obesity due to excess calories: Secondary | ICD-10-CM

## 2018-04-02 MED ORDER — VALACYCLOVIR HCL 500 MG PO TABS: ORAL_TABLET | ORAL | 3 refills | Status: DC

## 2018-04-02 NOTE — Progress Notes (Signed)
Subjective:    Patient ID: Carla [name], female    DOB: 1956-04-19, 62 y.o.   MRN: [id number]  HPI 63 year old Female for health maintenance exam and evaluation of medical issues.Much situational stress with husband in poor health, brother who lost his wife, job issues.  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  She would like to be excused from this because coworkers are complaining she is to slow and then get behind.  She feels this is causing a lot of stress at work for her.  Note provided.  Told her I am not sure that this would completely get her out of the assignment but she feels significant stress with this.  Husband continues to do poorly and does not look after himself very well.  He is a poorly controlled diabetic.  He is on disability.  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  [place] but she realizes that may be expensive.  She is frustrated that he will not try to find some type of part-time employment.  She says that he does not feel well most days and complains of some dizziness.  She does not have time to exercise because of working full-time.  History of allergic rhinitis treated with Singulair and Pulmicort.  Dr. Nori Riis has been GYN physician in the past.  She has had total abdominal hysterectomy and BSO for fibroids and right dermoid cyst in 2007.  Has not had recent GYN check.  Explained to her probably not necessary to do Pap at this point.  She has a history of HSV type II condyloma acuminata.  She is getting frequent outbreaks of HSV type II and daily Valtrex was prescribed today.  She has a history of diabetes mellitus hyperlipidemia obesity allergic rhinitis osteoarthritis and history of vitamin D deficiency.  She does not have history of hypertension but has been maintained on ACE inhibitor due to diabetes.  Had colonoscopy in 2013  History of fractured  right wrist 1990, history of partial phalangectomy right second toe 1990.  Endometrial polypectomy with D&C 1998.  Arthroscopic debridement of left hip in the spring 1999.  Left hip replacement November 1999.  Total abdominal hysterectomy BSO for fibroids and right dermoid cyst in 2007.  Right hip arthroplasty 2012.  Left bunionectomy and repair of hammertoe by Dr. Paulla Dolly December 2018.  Has seen Dr. Leafy Ro for obesity treatment in 2018 but currently is not following up with her.  Social history: Does not smoke.  Social alcohol consumption.  No children.  Family history: Father died of complications of Parkinson's disease.  Mother in fairly good health in her 58s.  One brother and one sister in good health.    Review of Systems more outbreaks of HSV Type 2, bilateral knee pain     Objective:   Physical Exam  Constitutional: She is oriented to person, place, and time. She appears well-developed and well-nourished.  HENT:  Head: Normocephalic and atraumatic.  Right Ear: External ear normal.  Left Ear: External ear normal.  Mouth/Throat: Oropharynx is clear and moist. No oropharyngeal exudate.  Eyes: Pupils are equal, round, and reactive to light. Conjunctivae are normal. Right eye exhibits no discharge. Left eye exhibits no discharge.  Neck: Neck supple. No JVD present. No thyromegaly present.  Cardiovascular: Normal rate, regular rhythm and normal heart sounds.  No murmur heard. Pulmonary/Chest: Effort  normal and breath sounds normal. No stridor. No respiratory distress. She has no wheezes. She has no rales.  Abdominal: Soft. Bowel sounds are normal. She exhibits no distension and no mass. There is no tenderness. There is no rebound and no guarding.  Genitourinary:  Genitourinary Comments: Deferred status post TAH/BSO  Musculoskeletal: She exhibits no edema.  Lymphadenopathy:    She has no cervical adenopathy.  Neurological: She is alert and oriented to person, place, and time. She  displays normal reflexes. No cranial nerve deficit. She exhibits normal muscle tone. Coordination normal.  Skin: Skin is warm and dry. No rash noted.  Psychiatric: She has a normal mood and affect. Her behavior is normal. Judgment and thought content normal.  Vitals reviewed.         Assessment & Plan:  Impaired glucose tolerance -hemoglobin A1c 5.9% and previously was 6.1%. Encourage diet and exercise along with weight loss   Mixed hyperlipidemia- lipids are normal on statin medication  Situational stress with husband and other family members along with job  Allergic rhinitis-stable  Frequent outbreaks of Herpes simplex type II-start Valtrex 500 mg daily  Health maintenance-flu vaccine given  Anxiety and depression-takes as needed Xanax  History of bilateral hip arthroplasties  Status post left bunion surgery 2018  BMI 30.80-encourage diet exercise and weight loss.  No longer seeing Dr. Leafy Ro.  Bilateral knee pain

## 2018-04-02 NOTE — Patient Instructions (Signed)
Continue to work on diet exercise and weight loss.  Continue statin medication.  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  Follow-up in 6 months.  Valtrex 500 mg daily for recurrent outbreaks of HSV type II.

## 2018-04-16 ENCOUNTER — Other Ambulatory Visit: Payer: Self-pay | Admitting: Internal Medicine

## 2018-04-29 ENCOUNTER — Other Ambulatory Visit: Payer: Self-pay | Admitting: Internal Medicine

## 2018-06-27 ENCOUNTER — Other Ambulatory Visit: Payer: Self-pay | Admitting: Internal Medicine

## 2018-07-10 ENCOUNTER — Other Ambulatory Visit: Payer: Self-pay | Admitting: Internal Medicine

## 2018-07-12 ENCOUNTER — Other Ambulatory Visit: Payer: Self-pay | Admitting: Internal Medicine

## 2018-07-25 ENCOUNTER — Other Ambulatory Visit: Payer: Self-pay | Admitting: Internal Medicine

## 2018-08-30 ENCOUNTER — Encounter: Payer: Self-pay | Admitting: *Deleted

## 2018-09-17 ENCOUNTER — Other Ambulatory Visit: Payer: BLUE CROSS/BLUE SHIELD | Admitting: Internal Medicine

## 2018-09-24 ENCOUNTER — Ambulatory Visit: Payer: BLUE CROSS/BLUE SHIELD | Admitting: Internal Medicine

## 2018-09-27 ENCOUNTER — Other Ambulatory Visit: Payer: Self-pay | Admitting: *Deleted

## 2018-09-27 NOTE — Patient Outreach (Signed)
[facility]) Care Management  09/27/2018  Carla [name] [birth year] [id number]   HTA HRA Telephone Screen     Outreach Attempt:  Successful telephone outreach to patient in the month of April to complete Health Risk Assessment Screening.  HIPAA verified with patient.  Patient reporting history of prediabetes with a recent Hgb A1C of 5.9.  Reports she is on no medications for diabetes.  Requesting information concerning creating Advance Directives.  Denies any further needs, at this time.   Plan:  Patient will be placed in a [place] with 3 month follow up to verify receipt of advance directive information.  RN Health Coach will send [facility] [place] Letter.  RN Health Coach sent Advance Directive Packet and EMMI Advance Directive.  RN Health Coach will make next telephone outreach to patient for follow up in the month of July.  [place] [phone] [email or web address]

## 2018-10-04 ENCOUNTER — Other Ambulatory Visit: Payer: Self-pay

## 2018-10-04 ENCOUNTER — Ambulatory Visit (INDEPENDENT_AMBULATORY_CARE_PROVIDER_SITE_OTHER): Payer: PPO | Admitting: Internal Medicine

## 2018-10-04 ENCOUNTER — Encounter: Payer: Self-pay | Admitting: Internal Medicine

## 2018-10-04 ENCOUNTER — Telehealth: Payer: Self-pay | Admitting: Internal Medicine

## 2018-10-04 DIAGNOSIS — F439 Reaction to severe stress, unspecified: Secondary | ICD-10-CM

## 2018-10-04 NOTE — Patient Instructions (Signed)
No charge. Pt had to cancel visit urgently as husband was in accident

## 2018-10-04 NOTE — Telephone Encounter (Signed)
Schedule virtual visit 

## 2018-10-04 NOTE — Telephone Encounter (Signed)
Scheduled virtual visit °

## 2018-10-04 NOTE — Telephone Encounter (Signed)
Zion Lint [phone]  [name] called to say she would like to get a note for work, stating her underlying conditions for getting the COVID-19. She is feeling very anxious about going back to work. I suggested virtual visit.

## 2018-10-04 NOTE — Progress Notes (Signed)
Pt called at 3:55 pm saying she was having trouble connecting to [facility]oxy and that husband had been in a car accident.She needs to reschedule today's visit due to husband's accident.

## 2018-10-05 ENCOUNTER — Ambulatory Visit (INDEPENDENT_AMBULATORY_CARE_PROVIDER_SITE_OTHER): Payer: PPO | Admitting: Internal Medicine

## 2018-10-05 DIAGNOSIS — F439 Reaction to severe stress, unspecified: Secondary | ICD-10-CM

## 2018-10-05 DIAGNOSIS — E782 Mixed hyperlipidemia: Secondary | ICD-10-CM | POA: Diagnosis not present

## 2018-10-05 DIAGNOSIS — F419 Anxiety disorder, unspecified: Secondary | ICD-10-CM

## 2018-10-05 DIAGNOSIS — E8881 Metabolic syndrome: Secondary | ICD-10-CM | POA: Diagnosis not present

## 2018-10-05 DIAGNOSIS — F32A Depression, unspecified: Secondary | ICD-10-CM

## 2018-10-05 DIAGNOSIS — E119 Type 2 diabetes mellitus without complications: Secondary | ICD-10-CM

## 2018-10-05 DIAGNOSIS — F329 Major depressive disorder, single episode, unspecified: Secondary | ICD-10-CM

## 2018-10-05 NOTE — Telephone Encounter (Signed)
Pt is calling back about her appt from yesterday that niether her or Dr Renold Genta was able to connect to and wanted to know what Dr Renold Genta wanted to do for an appt

## 2018-10-05 NOTE — Telephone Encounter (Signed)
Schedule virtual visit this am

## 2018-10-05 NOTE — Telephone Encounter (Signed)
done

## 2018-10-06 ENCOUNTER — Encounter: Payer: Self-pay | Admitting: Internal Medicine

## 2018-10-06 DIAGNOSIS — F419 Anxiety disorder, unspecified: Secondary | ICD-10-CM

## 2018-10-06 NOTE — Patient Instructions (Addendum)
Discussion regarding COVID-19 risks and work situation.  Letter will be prepared for her employer.
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
## 2018-10-08 ENCOUNTER — Telehealth: Payer: Self-pay | Admitting: Internal Medicine

## 2018-10-08 NOTE — Telephone Encounter (Signed)
Pt called about a message she received about [place] not being able to email a letter that her and Dr Renold Genta disscussed when they last had an appt, I explained what is going on and that we are trying to fix the issue, she said she needs it done within about a week and to please call her when we can send it

## 2018-10-08 NOTE — Telephone Encounter (Signed)
Scanner not working. Have called repairman

## 2018-10-09 ENCOUNTER — Other Ambulatory Visit: Payer: Self-pay | Admitting: Internal Medicine

## 2018-10-09 NOTE — Telephone Encounter (Signed)
Pt said she will come by today and get it

## 2018-10-16 NOTE — Telephone Encounter (Signed)
Pt picked up work note

## 2018-11-01 ENCOUNTER — Other Ambulatory Visit: Payer: Self-pay | Admitting: *Deleted

## 2018-11-01 NOTE — Patient Outreach (Signed)
[facility]) Care Management  11/01/2018  Carla [name] 02-12-1956 [id number]   Case Closure/Transition to [facility]   Outreach:  Patient case has been transitioned to [facility] for further Care Management assistance.  Plan: RN Health Coach will close case at this time. RN Health Coach will send primary care provider Care Management Case Closure Letter. RN Health Coach will make patient inactive with [facility] Care Management at this time.  [place] [phone] [name]

## 2018-11-21 ENCOUNTER — Ambulatory Visit: Payer: BLUE CROSS/BLUE SHIELD | Admitting: *Deleted

## 2018-12-05 ENCOUNTER — Other Ambulatory Visit: Payer: Self-pay

## 2019-01-07 ENCOUNTER — Other Ambulatory Visit: Payer: Self-pay | Admitting: Internal Medicine

## 2019-01-25 ENCOUNTER — Telehealth: Payer: Self-pay | Admitting: Internal Medicine

## 2019-01-25 ENCOUNTER — Other Ambulatory Visit: Payer: Self-pay | Admitting: Internal Medicine

## 2019-01-25 DIAGNOSIS — M1990 Unspecified osteoarthritis, unspecified site: Secondary | ICD-10-CM

## 2019-01-25 DIAGNOSIS — Z1231 Encounter for screening mammogram for malignant neoplasm of breast: Secondary | ICD-10-CM

## 2019-01-25 NOTE — Telephone Encounter (Signed)
Pt called earlier and scheduled her CPE and she just called back the Breast center and they said she was due for a bone density and that they need you to send and order for it to them and then they will call her to make an appointment. She said it is [facility]. She also wants to know which pneumonia shot she is do for so she can tell her pharmacy so they can give it to her, She said she goes to the [facility] and [facility]

## 2019-01-30 ENCOUNTER — Other Ambulatory Visit: Payer: Self-pay

## 2019-01-30 ENCOUNTER — Ambulatory Visit
Admission: RE | Admit: 2019-01-30 | Discharge: 2019-01-30 | Disposition: A | Discharge status: Home / Self Care | Payer: PPO | Source: Ambulatory Visit | Attending: Internal Medicine | Admitting: Internal Medicine

## 2019-01-30 DIAGNOSIS — Z1231 Encounter for screening mammogram for malignant neoplasm of breast: Secondary | ICD-10-CM | POA: Diagnosis not present

## 2019-02-05 ENCOUNTER — Other Ambulatory Visit: Payer: Self-pay | Admitting: Internal Medicine

## 2019-02-06 NOTE — Addendum Note (Signed)
Addended by: Mady Haagensen on: 02/06/2019 10:48 AM   Modules accepted: Orders

## 2019-02-06 NOTE — Telephone Encounter (Signed)
She can come here for Prevnar 13 and Araceli can place order for bone density study

## 2019-02-20 ENCOUNTER — Other Ambulatory Visit: Payer: Self-pay | Admitting: Internal Medicine

## 2019-02-20 DIAGNOSIS — Z13820 Encounter for screening for osteoporosis: Secondary | ICD-10-CM

## 2019-03-18 ENCOUNTER — Other Ambulatory Visit: Payer: Self-pay

## 2019-03-18 ENCOUNTER — Other Ambulatory Visit: Payer: PPO | Admitting: Internal Medicine

## 2019-03-18 DIAGNOSIS — F32A Depression, unspecified: Secondary | ICD-10-CM

## 2019-03-18 DIAGNOSIS — E119 Type 2 diabetes mellitus without complications: Secondary | ICD-10-CM

## 2019-03-18 DIAGNOSIS — H35033 Hypertensive retinopathy, bilateral: Secondary | ICD-10-CM | POA: Diagnosis not present

## 2019-03-18 DIAGNOSIS — R7309 Other abnormal glucose: Secondary | ICD-10-CM | POA: Diagnosis not present

## 2019-03-18 DIAGNOSIS — F439 Reaction to severe stress, unspecified: Secondary | ICD-10-CM

## 2019-03-18 DIAGNOSIS — E782 Mixed hyperlipidemia: Secondary | ICD-10-CM

## 2019-03-18 DIAGNOSIS — F419 Anxiety disorder, unspecified: Secondary | ICD-10-CM | POA: Diagnosis not present

## 2019-03-18 DIAGNOSIS — F329 Major depressive disorder, single episode, unspecified: Secondary | ICD-10-CM

## 2019-03-18 DIAGNOSIS — E8881 Metabolic syndrome: Secondary | ICD-10-CM

## 2019-03-18 DIAGNOSIS — Z0000 Encounter for general adult medical examination without abnormal findings: Secondary | ICD-10-CM

## 2019-03-18 DIAGNOSIS — H2513 Age-related nuclear cataract, bilateral: Secondary | ICD-10-CM | POA: Diagnosis not present

## 2019-03-18 DIAGNOSIS — H25013 Cortical age-related cataract, bilateral: Secondary | ICD-10-CM | POA: Diagnosis not present

## 2019-03-19 LAB — CBC WITH DIFFERENTIAL/PLATELET
Absolute Monocytes: 670 cells/uL (ref 200–950)
Basophils Absolute: 72 cells/uL (ref 0–200)
Basophils Relative: 1.1 %
Eosinophils Absolute: 189 cells/uL (ref 15–500)
Eosinophils Relative: 2.9 %
HCT: 38 % (ref 35.0–45.0)
Hemoglobin: 12.5 g/dL (ref 11.7–15.5)
Lymphs Abs: 2223 cells/uL (ref 850–3900)
MCH: 30.9 pg (ref 27.0–33.0)
MCHC: 32.9 g/dL (ref 32.0–36.0)
MCV: 94.1 fL (ref 80.0–100.0)
MPV: 10.4 fL (ref 7.5–12.5)
Monocytes Relative: 10.3 %
Neutro Abs: 3348 cells/uL (ref 1500–7800)
Neutrophils Relative %: 51.5 %
Platelets: 294 10*3/uL (ref 140–400)
RBC: 4.04 10*6/uL (ref 3.80–5.10)
RDW: 12.6 % (ref 11.0–15.0)
Total Lymphocyte: 34.2 %
WBC: 6.5 10*3/uL (ref 3.8–10.8)

## 2019-03-19 LAB — COMPLETE METABOLIC PANEL WITH GFR
AG Ratio: 1.8 (calc) (ref 1.0–2.5)
ALT: 19 U/L (ref 6–29)
AST: 22 U/L (ref 10–35)
Albumin: 4.2 g/dL (ref 3.6–5.1)
Alkaline phosphatase (APISO): 89 U/L (ref 37–153)
BUN: 19 mg/dL (ref 7–25)
CO2: 29 mmol/L (ref 20–32)
Calcium: 9.7 mg/dL (ref 8.6–10.4)
Chloride: 101 mmol/L (ref 98–110)
Creat: 0.84 mg/dL (ref 0.50–0.99)
GFR, Est African American: 85 mL/min/{1.73_m2} (ref >=60)
GFR, Est Non African American: 73 mL/min/{1.73_m2} (ref >=60)
Globulin: 2.4 g/dL (calc) (ref 1.9–3.7)
Glucose, Bld: 92 mg/dL (ref 65–99)
Potassium: 4.5 mmol/L (ref 3.5–5.3)
Sodium: 139 mmol/L (ref 135–146)
Total Bilirubin: 0.6 mg/dL (ref 0.2–1.2)
Total Protein: 6.6 g/dL (ref 6.1–8.1)

## 2019-03-19 LAB — LIPID PANEL
Cholesterol: 187 mg/dL (ref <200)
HDL: 58 mg/dL (ref >=50)
LDL Cholesterol (Calc): 101 mg/dL (calc) — ABNORMAL HIGH
Non-HDL Cholesterol (Calc): 129 mg/dL (calc) (ref <130)
Total CHOL/HDL Ratio: 3.2 (calc) (ref <5.0)
Triglycerides: 190 mg/dL — ABNORMAL HIGH (ref <150)

## 2019-03-19 LAB — HEMOGLOBIN A1C
Hgb A1c MFr Bld: 6 % of total Hgb — ABNORMAL HIGH (ref <5.7)
Mean Plasma Glucose: 126 (calc)
eAG (mmol/L): 7 (calc)

## 2019-03-19 LAB — TSH: TSH: 2.75 mIU/L (ref 0.40–4.50)

## 2019-03-25 ENCOUNTER — Encounter: Payer: PPO | Admitting: Internal Medicine

## 2019-03-29 ENCOUNTER — Telehealth: Payer: Self-pay | Admitting: Internal Medicine

## 2019-03-29 ENCOUNTER — Encounter: Payer: Self-pay | Admitting: Internal Medicine

## 2019-03-29 ENCOUNTER — Other Ambulatory Visit: Payer: Self-pay

## 2019-03-29 ENCOUNTER — Ambulatory Visit (INDEPENDENT_AMBULATORY_CARE_PROVIDER_SITE_OTHER): Payer: PPO | Admitting: Internal Medicine

## 2019-03-29 VITALS — BP 164/85 | Ht 61.0 in | Wt 163.0 lb

## 2019-03-29 DIAGNOSIS — Z20822 Contact with and (suspected) exposure to covid-19: Secondary | ICD-10-CM

## 2019-03-29 DIAGNOSIS — J22 Unspecified acute lower respiratory infection: Secondary | ICD-10-CM

## 2019-03-29 MED ORDER — HYDROCODONE-HOMATROPINE 5-1.5 MG/5ML PO SYRP: 5.0000 mL | ORAL_SOLUTION | Freq: Three times a day (TID) | ORAL | 0 refills | Status: DC | PRN

## 2019-03-29 MED ORDER — AZITHROMYCIN 250 MG PO TABS: ORAL_TABLET | ORAL | 0 refills | Status: DC

## 2019-03-29 NOTE — Patient Instructions (Addendum)
Go for Covid test at [facility].  Take Zithromax Z-PAK 2 p.o. day 1 followed by 1 p.o. days 2 through 5.  Hycodan 1 teaspoon p.o. every 8 hours as needed cough.  Rest and drink plenty of fluids.  Quarantine until COVID-19 test is back.

## 2019-03-29 NOTE — Progress Notes (Signed)
   Subjective:    Patient ID: Carla [name], female    DOB: 03-17-1956, 63 y.o.   MRN: [id number]  HPI 63 year old Female seen by interactive audio and video telecommunications due to the coronavirus pandemic.  She is identified using 2 identifiers as Carla [name]. [name], a longstanding patient in this practice.  She is agreeable to visit in this format today.  Patient says she has had Prevnar 13 and flu vaccine at [facility] on [address].  Says she had onset yesterday of  stuffy nose and dry cough.  She has a headache.  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  She works as a Geophysicist/field seismologist.  Her mother was recently hospitalized with Parkinson's disease after a fall.  Patient was there with her mother at the hospital for considerable amount of time.  No known exposure to COVID-19 but does see clients in a close setting doing massage.  Has not had any new clients.  These are established clients that she sees.  Having to help take care of her mother.  Her husband is also in poor health.    Review of Systems no documented fever chills nausea vomiting or myalgias     Objective:   Physical Exam Reports being afebrile.  Seen virtually in no acute distress       Assessment & Plan:  Acute lower respiratory infection  Impaired glucose tolerance  Essential hypertension  Plan: Her [id number] risk of complication score is 4.  Patient was advised to have COVID-19 test at testing center on [address] today.  She is to quarantine at home until results are back and then we will see when she can return to work safely.  I am starting her on Zithromax Z-PAK 2 p.o. day 1 followed by 1 p.o. days 2 through 5.  Hycodan 1 teaspoon p.o. every 8 hours as needed cough.  Addendum: April 01, 2019-her COVID-19 test is negative.  She called asking if she could return to work today but I think it would be more prudent for her to stay out of work today.  Plan: She works as a Geophysicist/field seismologist and could  possibly have had COVID-19 exposure inadvertently.  Her mother has Parkinson's disease.  Patient needs to be tested for COVID-19 and to quarantine until result is back.  She will be treated with Zithromax Z-PAK 2 p.o. day 1 followed by 1 p.o. days 2 through 5 and Hycodan 1 teaspoon p.o. every 8 hours as needed cough.  Rest and drink plenty of fluids.  Her Covid risk of complication score is 4.

## 2019-03-29 NOTE — Telephone Encounter (Signed)
She can be seen virtually

## 2019-03-29 NOTE — Telephone Encounter (Signed)
Carla [name] [phone]  [name] called to say that yesterday morning and then again last night and this morning she feels stuffy, cough, headache, feels really tired, No covid exposure that she knows of

## 2019-04-01 LAB — NOVEL CORONAVIRUS, NAA: SARS-CoV-2, NAA: NOT DETECTED

## 2019-04-01 NOTE — Telephone Encounter (Signed)
Called and spoke with patient she verbalized understanding  

## 2019-04-01 NOTE — Telephone Encounter (Signed)
Carla [name] called back to say she is feeling much better and that she had seen her negative results in [place]. She stopped having symptoms on Saturday and is wandering is it safe to go back to work today?Marland Kitchen

## 2019-04-01 NOTE — Telephone Encounter (Signed)
Safest to stay out today and maybe return tomorrow.

## 2019-04-12 ENCOUNTER — Other Ambulatory Visit: Payer: Self-pay

## 2019-04-12 ENCOUNTER — Ambulatory Visit
Admission: RE | Admit: 2019-04-12 | Discharge: 2019-04-12 | Disposition: A | Discharge status: Home / Self Care | Payer: PPO | Source: Ambulatory Visit | Attending: Internal Medicine | Admitting: Internal Medicine

## 2019-04-12 DIAGNOSIS — Z13820 Encounter for screening for osteoporosis: Secondary | ICD-10-CM

## 2019-04-12 DIAGNOSIS — M85832 Other specified disorders of bone density and structure, left forearm: Secondary | ICD-10-CM | POA: Diagnosis not present

## 2019-04-12 DIAGNOSIS — Z780 Asymptomatic menopausal state: Secondary | ICD-10-CM | POA: Diagnosis not present

## 2019-04-15 ENCOUNTER — Encounter: Payer: Self-pay | Admitting: Internal Medicine

## 2019-04-15 ENCOUNTER — Ambulatory Visit (INDEPENDENT_AMBULATORY_CARE_PROVIDER_SITE_OTHER): Payer: PPO | Admitting: Internal Medicine

## 2019-04-15 VITALS — Ht 61.0 in | Wt 163.0 lb

## 2019-04-15 DIAGNOSIS — M9903 Segmental and somatic dysfunction of lumbar region: Secondary | ICD-10-CM | POA: Diagnosis not present

## 2019-04-15 DIAGNOSIS — M9905 Segmental and somatic dysfunction of pelvic region: Secondary | ICD-10-CM | POA: Diagnosis not present

## 2019-04-15 DIAGNOSIS — E119 Type 2 diabetes mellitus without complications: Secondary | ICD-10-CM

## 2019-04-15 DIAGNOSIS — F32A Depression, unspecified: Secondary | ICD-10-CM

## 2019-04-15 DIAGNOSIS — F419 Anxiety disorder, unspecified: Secondary | ICD-10-CM

## 2019-04-15 DIAGNOSIS — E782 Mixed hyperlipidemia: Secondary | ICD-10-CM | POA: Diagnosis not present

## 2019-04-15 DIAGNOSIS — E8881 Metabolic syndrome: Secondary | ICD-10-CM

## 2019-04-15 DIAGNOSIS — M85832 Other specified disorders of bone density and structure, left forearm: Secondary | ICD-10-CM | POA: Diagnosis not present

## 2019-04-15 DIAGNOSIS — M5136 Other intervertebral disc degeneration, lumbar region: Secondary | ICD-10-CM | POA: Diagnosis not present

## 2019-04-15 DIAGNOSIS — M85831 Other specified disorders of bone density and structure, right forearm: Secondary | ICD-10-CM

## 2019-04-15 DIAGNOSIS — F329 Major depressive disorder, single episode, unspecified: Secondary | ICD-10-CM

## 2019-04-15 DIAGNOSIS — Z96643 Presence of artificial hip joint, bilateral: Secondary | ICD-10-CM | POA: Diagnosis not present

## 2019-04-15 MED ORDER — IBANDRONATE SODIUM 150 MG PO TABS: 150.0000 mg | ORAL_TABLET | ORAL | 0 refills | Status: DC

## 2019-04-18 ENCOUNTER — Other Ambulatory Visit: Payer: Self-pay

## 2019-04-18 ENCOUNTER — Other Ambulatory Visit: Payer: PPO | Admitting: Internal Medicine

## 2019-04-18 DIAGNOSIS — M8580 Other specified disorders of bone density and structure, unspecified site: Secondary | ICD-10-CM

## 2019-04-18 LAB — VITAMIN D 25 HYDROXY (VIT D DEFICIENCY, FRACTURES): Vit D, 25-Hydroxy: 34 ng/mL (ref 30–100)

## 2019-04-18 NOTE — Progress Notes (Signed)
   Subjective:    Patient ID: Carla [name], female    DOB: 09/06/1955, 63 y.o.   MRN: [id number]  HPI 63 year old Female seen today via interactive audio and video telecommunications due to the coronavirus pandemic.  She is agreeable to visit in this format today.  She is identified using 2 identifiers as Carla [name]. Fuhrman ,a patient in this practice.  The reason for the visit today is to discuss recent bone density study.  Patient had bone density study December 4.  She is status post bilateral hip replacements her hips cannot be measured.  Forearm radius had a T score of -2.0.  Previous left forearm radius T score was -1.5 in 2018.  Vitamin D level checked December 2 and is 34.  History of vitamin D deficiency on previous measurements in 2018 2017 in 2013.  Reviewed vitamin D supplementation with her.  She works as a Geophysicist/field seismologist.  Has a very physically active job.  She is also helping care for her elderly mother.  Patient has history of type II controlled diabetes and hyperlipidemia.  Husband is in poor health.  She has a history of depression and anxiety.  She has a history of allergic rhinitis.    Review of Systems see above     Objective:   Physical Exam  Not examined today but weight is 163 pounds BMI 30.80.      Assessment & Plan:  Osteopenia-patient is currently not on bone sparing therapy.  Have recommended she start Boniva 150 mg monthly.  Have recommended that she have repeat bone density study in 1 year and if this is not responding to [facility] then we will consider Prolia.  She is aware that Boniva may cause GE reflux and heartburn.  Advised her how to take Boniva appropriately on empty stomach 1 hour before meal and not to go back to bed afterwards for 1 hour.

## 2019-04-22 ENCOUNTER — Ambulatory Visit (INDEPENDENT_AMBULATORY_CARE_PROVIDER_SITE_OTHER): Payer: PPO | Admitting: Internal Medicine

## 2019-04-22 ENCOUNTER — Encounter: Payer: Self-pay | Admitting: Internal Medicine

## 2019-04-22 ENCOUNTER — Other Ambulatory Visit: Payer: Self-pay

## 2019-04-22 VITALS — Ht 61.0 in | Wt 163.0 lb

## 2019-04-22 DIAGNOSIS — Z0000 Encounter for general adult medical examination without abnormal findings: Secondary | ICD-10-CM

## 2019-04-23 ENCOUNTER — Telehealth: Payer: Self-pay | Admitting: Internal Medicine

## 2019-04-23 ENCOUNTER — Other Ambulatory Visit: Payer: Self-pay

## 2019-04-23 ENCOUNTER — Ambulatory Visit (INDEPENDENT_AMBULATORY_CARE_PROVIDER_SITE_OTHER): Payer: PPO | Admitting: Internal Medicine

## 2019-04-23 DIAGNOSIS — S46811A Strain of other muscles, fascia and tendons at shoulder and upper arm level, right arm, initial encounter: Secondary | ICD-10-CM

## 2019-04-23 MED ORDER — CYCLOBENZAPRINE HCL 10 MG PO TABS: 10.0000 mg | ORAL_TABLET | Freq: Every day | ORAL | 0 refills | Status: DC

## 2019-04-23 NOTE — Telephone Encounter (Signed)
Scheduled appointment

## 2019-04-23 NOTE — Telephone Encounter (Signed)
Set up virtual visit 

## 2019-04-23 NOTE — Progress Notes (Signed)
Subjective:     Patient ID: Carla [name], female   DOB: 01/31/1956, 63 y.o.   MRN: [id number]  HPI 63 year old Female seen by interactive audio and video telecommunications due to Coronavirus pandemic.This is her first medicare Wellness visit.  She is identified using 2 identifiers as Carla Estepp. [name], a patient in this practice.  She is agreeable to visit in this format today.  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   There is a hairdresser downstairs who has recently tested positive for Covid.  She saw the hairdresser as a client for massage 1-1/2 weeks before the hairdresser was exposed to COVID-19.  I feel the patient did not have significant exposure.  The patient is asymptomatic with regard to COVID-19 symptoms.  She does wear a mask during her work.  She does not eat in the break room downstairs  Subjective:   Patient presents for Medicare Annual/Subsequent preventive examination.  Review Past Medical/Family/Social: See above-mother has Parkinson's disease and she has had to spend a lot of time with her recently.  Now has hired some caretakers to help.  Husband is in poor health.   Risk Factors  Current exercise habits: Some light exercise but is active at work Dietary issues discussed: Low-fat low carbohydrate  Cardiac risk factors: glucose intolerance, Hyperlipidemia  Depression Screen  (Note: if answer to either of the following is "Yes", a more complete depression screening is indicated)   Over the past two weeks, have you felt down, depressed or hopeless? No  Over the past two weeks, have you felt little interest or pleasure in doing things? No Have you lost interest or pleasure in daily life? No Do you often feel hopeless? No Do you cry easily over simple problems? No   Activities of Daily Living  In your present state of health, do you have any difficulty performing the following activities?:   Driving? No  Managing  money? No  Feeding yourself? No  Getting from bed to chair? No  Climbing a flight of stairs? No  Preparing food and eating?: No  Bathing or showering? No  Getting dressed: No  Getting to the toilet? No  Using the toilet:No  Moving around from place to place: No  In the past year have you fallen or had a near fall?:No  Are you sexually active? yes Do you have more than one partner? No   Hearing Difficulties: No  Do you often ask people to speak up or repeat themselves? No  Do you experience ringing or noises in your ears? No  Do you have difficulty understanding soft or whispered voices? No  Do you feel that you have a problem with memory? No Do you often misplace items? No    Home Safety:  Do you have a smoke alarm at your residence? Yes Do you have grab bars in the bathroom?  Yes Do you have throw rugs in your house?  Yes in the bathroom   Cognitive Testing  Alert? Yes Normal Appearance?Yes  Oriented to person? Yes Place? Yes  Time? Yes  Recall of three objects? Yes  Can perform simple calculations? Yes  Displays appropriate judgment?Yes  Can read the correct time from a watch face?Yes   List the Names of Other Physician/Practitioners you currently use:  See referral list for the physicians patient is currently seeing.  No new complaints   Review of Systems: See above   Objective:  General appearance: Appears stated age  Head: Normocephalic, without obvious abnormality, atraumatic  Eyes: Without discharge Lungs: Cannot examine virtually Breasts: Cannot examine virtually Heart: Cannot examine virtually Abdomen: Cannot examine virtually Musculoskeletal: No complaint of musculoskeletal pain Skin: Skin color, texture, turgor normal. No rashes or lesions  Neurologic: CN 2 -12 Normal, Normal symmetric reflexes. Normal coordination and gait  Psych: Alert & Oriented x 3, Mood appear stable.    Assessment:    Annual wellness medicare exam   Plan:     During the course of the visit the patient was educated and counseled about appropriate screening and preventive services including:   Had Prevnar 21 in October 2020  Has had flu vaccine November 2020  Tetanus immunization is up-to-date  Has had 1 Shingrix vaccine and will get the other one once the pandemic has improved     Patient Instructions (the written plan) was given to the patient.  Medicare Attestation  I have personally reviewed:  The patient's medical and social history  Their use of alcohol, tobacco or illicit drugs  Their current medications and supplements  The patient's functional ability including ADLs,fall risks, home safety risks, cognitive, and hearing and visual impairment  Diet and physical activities  Evidence for depression or mood disorders  The patient's weight, height, BMI, and visual acuity have been recorded in the chart. I have made referrals, counseling, and provided education to the patient based on review of the above and I have provided the patient with a written personalized care plan for preventive services.    She does not smoke.  Social alcohol consumption.  No children.  Father died of complications of Parkinson's disease.  1 brother and 1 sister in good health.  History of HSV type II and bilateral knee pain.  Review of Systems see above     Objective:  Seen virtually-thinks about situational stress with her mother     Assessment:  Situational stress with mother-discussed  History of impaired glucose tolerance-hemoglobin A1c in November was 6%  Hyperlipidemia-triglycerides are elevated at 190.  LDL was 101 and total cholesterol was 187  Osteopenia-had recent bone density study and T score in the left forearm radius increased from -1.5 in 2018 to -2.0 and she was started on Boniva with follow-up bone density study in 2 years  History of allergic rhinitis treated with Singulair and Pulmicort  History of vitamin D  deficiency  Encourage diet exercise and weight loss.  In 2019 weight was 163 pounds with BMI of 30.80.  Osteoarthritis-status post 2 hip replacements, right hip arthroplasty 2012, left hip arthroplasty November 1999  History of total abdominal hysterectomy BSO and right dermoid cyst removal in 2007  Had colonoscopy in 2013       Plan:     Continue current medications including low-dose Altace for renal protection with impaired glucose tolerance.  Continue current medications.  Recently placed on Boniva for osteopenia.  Follow-up in 6 months.

## 2019-04-23 NOTE — Telephone Encounter (Signed)
Carla [name] [phone]  [name] called to say that last evening she thinks she has pulled a muscle in her right shoulder. She put ice on it, it did not help, so she put heat on it, it helped a little, she also took some aleve, it did not help. She had a lot of trouble getting comfortable and going to sleep. She thinks she may need PT, I let her know she may need OV first, so that we may do referral.

## 2019-04-23 NOTE — Patient Instructions (Addendum)
Has appt booked for in person PE in March. Encourage diet exercise and weight loss.

## 2019-04-23 NOTE — Progress Notes (Signed)
   Subjective:    Patient ID: Carla [name], female    DOB: 11-20-1955, 63 y.o.   MRN: [id number]  HPI 63 year old Female called complaining of acute pain in right shoulder and neck.  Thinks that she pulled a muscle.  Reminded her that she had recently been helping her mother out so could have had some strain with lifting her mother.  She is seen today by interactive audio and video telecommunications due to the coronavirus pandemic.  She is identified using 2 identifiers as Carla [name], a patient in this practice.  She is agreeable to visit in this format today.  Seen at home in moderate distress.    Review of Systems no fever chills myalgias nausea or vomiting     Objective:   Physical Exam  Has point tenderness she says along right trapezius muscle area where she points to area of pain.  Not really paracervical muscle tenderness.      Assessment & Plan:  Right trapezius muscle strain-likely due to lifting her mother  Plan: She would like referral for physical therapy.  Referral will be made.  She thinks dry needling would be helpful.  This can be done by physical therapist.  Have called in Flexeril 10 mg at bedtime as muscle relaxant.

## 2019-04-24 ENCOUNTER — Ambulatory Visit: Payer: PPO | Admitting: Internal Medicine

## 2019-04-26 NOTE — Telephone Encounter (Signed)
Cancel referral please

## 2019-04-26 NOTE — Telephone Encounter (Signed)
Carla [name] called back to say she had not heard from anyone about PT for the dry needling, and she is better, so she does not feel like she will need it now. She is going to go back to work.

## 2019-05-04 ENCOUNTER — Other Ambulatory Visit: Payer: Self-pay | Admitting: Internal Medicine

## 2019-05-08 ENCOUNTER — Other Ambulatory Visit: Payer: Self-pay | Admitting: Internal Medicine

## 2019-05-09 NOTE — Patient Instructions (Signed)
Begin Boniva 150 mg monthly and follow-up with bone density study in 1 year.  Ensure adequate intake of vitamin D.

## 2019-05-12 ENCOUNTER — Encounter: Payer: Self-pay | Admitting: Internal Medicine

## 2019-05-12 NOTE — Patient Instructions (Signed)
Physical therapy referral made.  Flexeril 10 mg at bedtime.  Ice or heat to sore muscle right trapezius

## 2019-05-27 DIAGNOSIS — M65831 Other synovitis and tenosynovitis, right forearm: Secondary | ICD-10-CM | POA: Diagnosis not present

## 2019-05-27 DIAGNOSIS — M65839 Other synovitis and tenosynovitis, unspecified forearm: Secondary | ICD-10-CM | POA: Diagnosis not present

## 2019-05-27 DIAGNOSIS — M25531 Pain in right wrist: Secondary | ICD-10-CM | POA: Diagnosis not present

## 2019-06-12 ENCOUNTER — Other Ambulatory Visit: Payer: Self-pay | Admitting: Internal Medicine

## 2019-06-16 ENCOUNTER — Ambulatory Visit: Payer: PPO | Attending: Internal Medicine

## 2019-06-16 DIAGNOSIS — Z23 Encounter for immunization: Secondary | ICD-10-CM | POA: Insufficient documentation

## 2019-06-16 NOTE — Progress Notes (Signed)
   Covid-19 Vaccination Clinic  Name:  Carla [name]    MRN: [id number] DOB: 10/29/1955  06/16/2019  [name] was observed post Covid-19 immunization for 15 minutes without incidence. She was provided with Vaccine Information Sheet and instruction to access the V-Safe system.   [name] was instructed to call 911 with any severe reactions post vaccine: Marland Kitchen Difficulty breathing  . Swelling of your face and throat  . A fast heartbeat  . A bad rash all over your body  . Dizziness and weakness    Immunizations Administered    Name Date Dose VIS Date Route   Pfizer COVID-19 Vaccine 06/16/2019  9:09 AM 0.3 mL 04/19/2019 Intramuscular   Manufacturer: [place]   Lot: [id number]   [place]: [id number]

## 2019-07-01 ENCOUNTER — Ambulatory Visit: Payer: PPO

## 2019-07-09 ENCOUNTER — Other Ambulatory Visit: Payer: Self-pay

## 2019-07-09 MED ORDER — IBANDRONATE SODIUM 150 MG PO TABS: 150.0000 mg | ORAL_TABLET | ORAL | 1 refills | Status: DC

## 2019-07-09 NOTE — Telephone Encounter (Signed)
Last refill 04/15/19 #3 no refills.

## 2019-07-10 ENCOUNTER — Ambulatory Visit: Payer: PPO | Attending: Internal Medicine

## 2019-07-10 DIAGNOSIS — Z23 Encounter for immunization: Secondary | ICD-10-CM | POA: Insufficient documentation

## 2019-07-10 NOTE — Progress Notes (Signed)
   Covid-19 Vaccination Clinic  Name:  Carla [name]    MRN: [id number] DOB: 09-04-1955  07/10/2019  Carla [name] was observed post Covid-19 immunization for 15 minutes without incident. She was provided with Vaccine Information Sheet and instruction to access the V-Safe system.   Carla [name] was instructed to call 911 with any severe reactions post vaccine: Marland Kitchen Difficulty breathing  . Swelling of face and throat  . A fast heartbeat  . A bad rash all over body  . Dizziness and weakness   Immunizations Administered    Name Date Dose VIS Date Route   Pfizer COVID-19 Vaccine 07/10/2019  8:17 AM 0.3 mL 04/19/2019 Intramuscular   Manufacturer: [place]   Lot: [id number]   [place]: [id number]

## 2019-07-14 ENCOUNTER — Other Ambulatory Visit: Payer: Self-pay | Admitting: Internal Medicine

## 2019-07-15 DIAGNOSIS — M25511 Pain in right shoulder: Secondary | ICD-10-CM | POA: Diagnosis not present

## 2019-07-18 ENCOUNTER — Telehealth: Payer: Self-pay | Admitting: Internal Medicine

## 2019-07-18 NOTE — Telephone Encounter (Signed)
I was looking in office notes to see when we had seen patient for shoulder pain, and I seen where she had been to [facility] on Monday for this shoulder pain. Called patient back and she stated that the doctor she seen was not recommending dry needling that if the pain did not get better he would want her to have MRI next week. I suggested she call them back, because for the referral they are going to need the office notes to where she had been seen for shoulder pain and we can not send another doctors notes to them. So she is going to call [facility].

## 2019-07-18 NOTE — Telephone Encounter (Signed)
Carla [name] [phone]  [name] called to say she had found out that [place] was on her insurance and she would like a referral to them for dry needling.

## 2019-07-22 ENCOUNTER — Encounter: Payer: PPO | Admitting: Internal Medicine

## 2019-07-30 ENCOUNTER — Ambulatory Visit (INDEPENDENT_AMBULATORY_CARE_PROVIDER_SITE_OTHER): Payer: PPO | Admitting: Internal Medicine

## 2019-07-30 ENCOUNTER — Other Ambulatory Visit: Payer: Self-pay

## 2019-07-30 ENCOUNTER — Encounter: Payer: Self-pay | Admitting: Internal Medicine

## 2019-07-30 VITALS — BP 140/90 | HR 73 | Temp 98.0°F | Ht 61.0 in | Wt 169.0 lb

## 2019-07-30 DIAGNOSIS — F329 Major depressive disorder, single episode, unspecified: Secondary | ICD-10-CM

## 2019-07-30 DIAGNOSIS — A6000 Herpesviral infection of urogenital system, unspecified: Secondary | ICD-10-CM | POA: Diagnosis not present

## 2019-07-30 DIAGNOSIS — F32A Depression, unspecified: Secondary | ICD-10-CM

## 2019-07-30 DIAGNOSIS — E782 Mixed hyperlipidemia: Secondary | ICD-10-CM | POA: Diagnosis not present

## 2019-07-30 DIAGNOSIS — R7302 Impaired glucose tolerance (oral): Secondary | ICD-10-CM | POA: Diagnosis not present

## 2019-07-30 DIAGNOSIS — F419 Anxiety disorder, unspecified: Secondary | ICD-10-CM

## 2019-07-30 DIAGNOSIS — Z96643 Presence of artificial hip joint, bilateral: Secondary | ICD-10-CM | POA: Diagnosis not present

## 2019-07-30 DIAGNOSIS — Z0000 Encounter for general adult medical examination without abnormal findings: Secondary | ICD-10-CM

## 2019-07-30 DIAGNOSIS — R030 Elevated blood-pressure reading, without diagnosis of hypertension: Secondary | ICD-10-CM

## 2019-07-30 DIAGNOSIS — M85831 Other specified disorders of bone density and structure, right forearm: Secondary | ICD-10-CM | POA: Diagnosis not present

## 2019-07-30 DIAGNOSIS — F439 Reaction to severe stress, unspecified: Secondary | ICD-10-CM | POA: Diagnosis not present

## 2019-07-30 DIAGNOSIS — M85832 Other specified disorders of bone density and structure, left forearm: Secondary | ICD-10-CM

## 2019-07-30 DIAGNOSIS — M19011 Primary osteoarthritis, right shoulder: Secondary | ICD-10-CM | POA: Diagnosis not present

## 2019-07-30 DIAGNOSIS — Z6831 Body mass index (BMI) 31.0-31.9, adult: Secondary | ICD-10-CM | POA: Diagnosis not present

## 2019-07-30 DIAGNOSIS — E8881 Metabolic syndrome: Secondary | ICD-10-CM
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
## 2019-07-31 DIAGNOSIS — M25511 Pain in right shoulder: Secondary | ICD-10-CM | POA: Diagnosis not present

## 2019-07-31 NOTE — Patient Instructions (Signed)
It was a pleasure to see you today.  We are sorry you are having so much difficulty with family members.  Take care of yourself.  Follow-up with Dr. Tonita Cong regarding MRI of the right shoulder.  Continue with current medications.  Watch diet and try to walk some for exercise.  Follow-up in 6 months.

## 2019-08-05 DIAGNOSIS — M25511 Pain in right shoulder: Secondary | ICD-10-CM | POA: Diagnosis not present

## 2019-08-05 DIAGNOSIS — M810 Age-related osteoporosis without current pathological fracture: Secondary | ICD-10-CM | POA: Diagnosis not present

## 2019-08-05 DIAGNOSIS — M25811 Other specified joint disorders, right shoulder: Secondary | ICD-10-CM | POA: Diagnosis not present

## 2019-08-19 ENCOUNTER — Telehealth: Payer: Self-pay | Admitting: Internal Medicine

## 2019-08-19 DIAGNOSIS — M25811 Other specified joint disorders, right shoulder: Secondary | ICD-10-CM | POA: Diagnosis not present

## 2019-08-19 DIAGNOSIS — M810 Age-related osteoporosis without current pathological fracture: Secondary | ICD-10-CM | POA: Diagnosis not present

## 2019-08-19 NOTE — Telephone Encounter (Signed)
Faxed signed surgery clearence to Dr Susa Day [phone], office number [phone].

## 2019-09-02 ENCOUNTER — Other Ambulatory Visit: Payer: Self-pay | Admitting: Internal Medicine

## 2019-09-09 ENCOUNTER — Other Ambulatory Visit: Payer: Self-pay | Admitting: Internal Medicine

## 2019-09-16 DIAGNOSIS — G8918 Other acute postprocedural pain: Secondary | ICD-10-CM | POA: Diagnosis not present

## 2019-09-16 DIAGNOSIS — M659 Synovitis and tenosynovitis, unspecified: Secondary | ICD-10-CM | POA: Diagnosis not present

## 2019-09-16 DIAGNOSIS — X58XXXA Exposure to other specified factors, initial encounter: Secondary | ICD-10-CM | POA: Diagnosis not present

## 2019-09-16 DIAGNOSIS — S46011A Strain of muscle(s) and tendon(s) of the rotator cuff of right shoulder, initial encounter: Secondary | ICD-10-CM | POA: Diagnosis not present

## 2019-09-16 DIAGNOSIS — M7551 Bursitis of right shoulder: Secondary | ICD-10-CM | POA: Diagnosis not present

## 2019-09-16 DIAGNOSIS — W19XXXA Unspecified fall, initial encounter: Secondary | ICD-10-CM | POA: Diagnosis not present

## 2019-09-16 DIAGNOSIS — M7541 Impingement syndrome of right shoulder: Secondary | ICD-10-CM | POA: Diagnosis not present

## 2019-10-01 DIAGNOSIS — M25511 Pain in right shoulder: Secondary | ICD-10-CM | POA: Diagnosis not present

## 2019-10-03 ENCOUNTER — Other Ambulatory Visit: Payer: Self-pay | Admitting: Internal Medicine

## 2019-10-03 DIAGNOSIS — M25511 Pain in right shoulder: Secondary | ICD-10-CM | POA: Diagnosis not present

## 2019-10-08 DIAGNOSIS — M25511 Pain in right shoulder: Secondary | ICD-10-CM | POA: Diagnosis not present

## 2019-10-10 DIAGNOSIS — M25511 Pain in right shoulder: Secondary | ICD-10-CM | POA: Diagnosis not present

## 2019-10-14 DIAGNOSIS — M25511 Pain in right shoulder: Secondary | ICD-10-CM | POA: Diagnosis not present

## 2019-10-17 DIAGNOSIS — M25511 Pain in right shoulder: Secondary | ICD-10-CM | POA: Diagnosis not present

## 2019-10-22 DIAGNOSIS — M25511 Pain in right shoulder: Secondary | ICD-10-CM | POA: Diagnosis not present

## 2019-10-24 DIAGNOSIS — M25511 Pain in right shoulder: Secondary | ICD-10-CM | POA: Diagnosis not present

## 2019-10-30 DIAGNOSIS — M25511 Pain in right shoulder: Secondary | ICD-10-CM | POA: Diagnosis not present

## 2019-11-01 DIAGNOSIS — M25511 Pain in right shoulder: Secondary | ICD-10-CM | POA: Diagnosis not present

## 2019-11-04 DIAGNOSIS — M25511 Pain in right shoulder: Secondary | ICD-10-CM | POA: Diagnosis not present

## 2019-11-06 DIAGNOSIS — M25511 Pain in right shoulder: Secondary | ICD-10-CM | POA: Diagnosis not present

## 2019-11-13 DIAGNOSIS — M25511 Pain in right shoulder: Secondary | ICD-10-CM | POA: Diagnosis not present

## 2019-11-15 DIAGNOSIS — M25511 Pain in right shoulder: Secondary | ICD-10-CM | POA: Diagnosis not present

## 2019-11-19 DIAGNOSIS — M25511 Pain in right shoulder: Secondary | ICD-10-CM | POA: Diagnosis not present

## 2019-11-21 DIAGNOSIS — M25511 Pain in right shoulder: Secondary | ICD-10-CM | POA: Diagnosis not present

## 2019-11-25 DIAGNOSIS — M25511 Pain in right shoulder: Secondary | ICD-10-CM | POA: Diagnosis not present

## 2019-11-27 DIAGNOSIS — M25511 Pain in right shoulder: Secondary | ICD-10-CM | POA: Diagnosis not present

## 2019-12-03 ENCOUNTER — Other Ambulatory Visit: Payer: Self-pay

## 2019-12-03 DIAGNOSIS — M25511 Pain in right shoulder: Secondary | ICD-10-CM | POA: Diagnosis not present

## 2019-12-05 DIAGNOSIS — M25511 Pain in right shoulder: Secondary | ICD-10-CM | POA: Diagnosis not present

## 2019-12-10 DIAGNOSIS — M25511 Pain in right shoulder: Secondary | ICD-10-CM | POA: Diagnosis not present

## 2019-12-12 DIAGNOSIS — M25511 Pain in right shoulder: Secondary | ICD-10-CM | POA: Diagnosis not present

## 2019-12-16 DIAGNOSIS — M25511 Pain in right shoulder: Secondary | ICD-10-CM | POA: Diagnosis not present

## 2019-12-19 DIAGNOSIS — M25511 Pain in right shoulder: Secondary | ICD-10-CM | POA: Diagnosis not present

## 2019-12-24 DIAGNOSIS — M25511 Pain in right shoulder: Secondary | ICD-10-CM | POA: Diagnosis not present

## 2019-12-27 DIAGNOSIS — M25511 Pain in right shoulder: Secondary | ICD-10-CM | POA: Diagnosis not present

## 2019-12-31 DIAGNOSIS — M25511 Pain in right shoulder: Secondary | ICD-10-CM | POA: Diagnosis not present

## 2020-01-01 ENCOUNTER — Other Ambulatory Visit: Payer: Self-pay | Admitting: Internal Medicine

## 2020-01-01 NOTE — Telephone Encounter (Signed)
Needs to call me back to book CPE in December.

## 2020-01-03 DIAGNOSIS — M25511 Pain in right shoulder: Secondary | ICD-10-CM | POA: Diagnosis not present

## 2020-01-06 DIAGNOSIS — M25511 Pain in right shoulder: Secondary | ICD-10-CM | POA: Diagnosis not present

## 2020-01-09 DIAGNOSIS — M25511 Pain in right shoulder: Secondary | ICD-10-CM | POA: Diagnosis not present

## 2020-01-14 ENCOUNTER — Other Ambulatory Visit: Payer: Self-pay

## 2020-01-14 ENCOUNTER — Other Ambulatory Visit: Payer: PPO | Admitting: Internal Medicine

## 2020-01-14 DIAGNOSIS — M25511 Pain in right shoulder: Secondary | ICD-10-CM | POA: Diagnosis not present

## 2020-01-14 DIAGNOSIS — E782 Mixed hyperlipidemia: Secondary | ICD-10-CM

## 2020-01-14 DIAGNOSIS — R7302 Impaired glucose tolerance (oral): Secondary | ICD-10-CM | POA: Diagnosis not present

## 2020-01-15 LAB — HEMOGLOBIN A1C
Hgb A1c MFr Bld: 5.9 % of total Hgb — ABNORMAL HIGH (ref <5.7)
Mean Plasma Glucose: 123 (calc)
eAG (mmol/L): 6.8 (calc)

## 2020-01-15 LAB — LIPID PANEL
Cholesterol: 203 mg/dL — ABNORMAL HIGH (ref <200)
HDL: 43 mg/dL — ABNORMAL LOW (ref >=50)
LDL Cholesterol (Calc): 112 mg/dL (calc) — ABNORMAL HIGH
Non-HDL Cholesterol (Calc): 160 mg/dL (calc) — ABNORMAL HIGH (ref <130)
Total CHOL/HDL Ratio: 4.7 (calc) (ref <5.0)
Triglycerides: 334 mg/dL — ABNORMAL HIGH (ref <150)

## 2020-01-15 LAB — HEPATIC FUNCTION PANEL
AG Ratio: 1.8 (calc) (ref 1.0–2.5)
ALT: 15 U/L (ref 6–29)
AST: 20 U/L (ref 10–35)
Albumin: 4.2 g/dL (ref 3.6–5.1)
Alkaline phosphatase (APISO): 77 U/L (ref 37–153)
Bilirubin, Direct: 0.1 mg/dL (ref 0.0–0.2)
Globulin: 2.3 g/dL (calc) (ref 1.9–3.7)
Indirect Bilirubin: 0.3 mg/dL (calc) (ref 0.2–1.2)
Total Bilirubin: 0.4 mg/dL (ref 0.2–1.2)
Total Protein: 6.5 g/dL (ref 6.1–8.1)

## 2020-01-16 ENCOUNTER — Encounter: Payer: Self-pay | Admitting: Internal Medicine

## 2020-01-16 ENCOUNTER — Other Ambulatory Visit: Payer: Self-pay

## 2020-01-16 ENCOUNTER — Ambulatory Visit (INDEPENDENT_AMBULATORY_CARE_PROVIDER_SITE_OTHER): Payer: PPO | Admitting: Internal Medicine

## 2020-01-16 VITALS — BP 110/70 | HR 74 | Ht 61.0 in | Wt 172.0 lb

## 2020-01-16 DIAGNOSIS — M85831 Other specified disorders of bone density and structure, right forearm: Secondary | ICD-10-CM | POA: Diagnosis not present

## 2020-01-16 DIAGNOSIS — R7302 Impaired glucose tolerance (oral): Secondary | ICD-10-CM | POA: Diagnosis not present

## 2020-01-16 DIAGNOSIS — F439 Reaction to severe stress, unspecified: Secondary | ICD-10-CM

## 2020-01-16 DIAGNOSIS — F329 Major depressive disorder, single episode, unspecified: Secondary | ICD-10-CM

## 2020-01-16 DIAGNOSIS — A6000 Herpesviral infection of urogenital system, unspecified: Secondary | ICD-10-CM

## 2020-01-16 DIAGNOSIS — E8881 Metabolic syndrome: Secondary | ICD-10-CM | POA: Diagnosis not present

## 2020-01-16 DIAGNOSIS — Z6832 Body mass index (BMI) 32.0-32.9, adult: Secondary | ICD-10-CM

## 2020-01-16 DIAGNOSIS — F419 Anxiety disorder, unspecified: Secondary | ICD-10-CM | POA: Diagnosis not present

## 2020-01-16 DIAGNOSIS — Z96643 Presence of artificial hip joint, bilateral: Secondary | ICD-10-CM

## 2020-01-16 DIAGNOSIS — F32A Depression, unspecified: Secondary | ICD-10-CM

## 2020-01-16 DIAGNOSIS — M85832 Other specified disorders of bone density and structure, left forearm: Secondary | ICD-10-CM | POA: Diagnosis not present

## 2020-01-16 DIAGNOSIS — E782 Mixed hyperlipidemia: Secondary | ICD-10-CM | POA: Diagnosis not present

## 2020-01-16 DIAGNOSIS — M25511 Pain in right shoulder: Secondary | ICD-10-CM | POA: Diagnosis not present

## 2020-01-16 NOTE — Progress Notes (Signed)
   Subjective:    Patient ID: Carla [name], female    DOB: 1956/02/14, 64 y.o.   MRN: [id number]  HPI She is somewhat depressed. [place] seem to be against that. Discussed the situation at length. Husband has some debt she was not aware of. His health is poor.[place].She has completed PT s/p shoulder surgery.  She has allergic rhinitis for which she takes Allegra and Singulair.  Takes Wellbutrin and Xanax for anxiety and depression.  Is on Lipitor for hyperlipidemia.  History of HSV type II outbreaks and she takes Valtrex 500 mg daily.  Mother passed away due to complications of Parkinson's disease.  Surgery for right shoulder arthropathy in May.  Started on [place] in March for osteopenia.  Also takes vitamin D supplement.  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  History of diabetes mellitus, bilateral hip replacements.  Does not have hypertension but has been maintained on low-dose ACE inhibitor due to diabetes.  Triglycerides are elevated at 334, HDL 43 total cholesterol 203 LDL 112.  Hemoglobin A1c 5.9%.  Liver functions are normal.      Review of Systems-see above     Objective:   Physical Exam Weight 172 pounds has gained 3 pounds  Neck is supple without JVD thyromegaly or carotid bruits.  Chest clear to auscultation.  Cardiac exam regular rate and rhythm.  Extremities without edema.     Assessment & Plan:  Type 2 diabetes mellitus with hemoglobin A1c 5.9%.  Maintained on diet alone.  His home low-dose Altace for renal protection.  Does not have hypertension.  Osteopenia treated with Boniva  Status post shoulder arthroplasty due to injury and is finishing up physical therapy.  Depression treated with Wellbutrin  Anxiety treated with Xanax  Hyperlipidemia-triglycerides are markedly elevated.  Work on diet exercise and weight loss.  Continue Lipitor 20 mg daily.  We discussed going up to  40 mg but she prefers not to do this at present time.  Plan: If she is still living here in 6 months she is to return for health maintenance exam.  [place] beach but has been problematic for stated above.  Have flu vaccine in the fall and Covid booster when available.

## 2020-01-23 ENCOUNTER — Other Ambulatory Visit: Payer: Self-pay | Admitting: Internal Medicine

## 2020-02-05 ENCOUNTER — Other Ambulatory Visit: Payer: Self-pay | Admitting: Internal Medicine

## 2020-02-05 ENCOUNTER — Encounter: Payer: Self-pay | Admitting: Internal Medicine

## 2020-02-05 DIAGNOSIS — Z1231 Encounter for screening mammogram for malignant neoplasm of breast: Secondary | ICD-10-CM

## 2020-02-05 NOTE — Patient Instructions (Signed)
Please watch her diet and try to get some exercise.  Continue current medications as previously prescribed.  Follow-up in 6 months.  Have annual flu vaccine in the fall.  Had Covid booster when available.

## 2020-02-17 DIAGNOSIS — M9903 Segmental and somatic dysfunction of lumbar region: Secondary | ICD-10-CM | POA: Diagnosis not present

## 2020-02-17 DIAGNOSIS — M545 Low back pain, unspecified: Secondary | ICD-10-CM | POA: Diagnosis not present

## 2020-02-17 DIAGNOSIS — M9905 Segmental and somatic dysfunction of pelvic region: Secondary | ICD-10-CM | POA: Diagnosis not present

## 2020-02-17 DIAGNOSIS — M9904 Segmental and somatic dysfunction of sacral region: Secondary | ICD-10-CM | POA: Diagnosis not present

## 2020-02-21 ENCOUNTER — Ambulatory Visit
Admission: RE | Admit: 2020-02-21 | Discharge: 2020-02-21 | Disposition: A | Discharge status: Home / Self Care | Payer: PPO | Source: Ambulatory Visit | Attending: Internal Medicine | Admitting: Internal Medicine

## 2020-02-21 ENCOUNTER — Other Ambulatory Visit: Payer: Self-pay

## 2020-02-21 DIAGNOSIS — Z1231 Encounter for screening mammogram for malignant neoplasm of breast: Secondary | ICD-10-CM | POA: Diagnosis not present

## 2020-02-24 DIAGNOSIS — M9903 Segmental and somatic dysfunction of lumbar region: Secondary | ICD-10-CM | POA: Diagnosis not present

## 2020-02-24 DIAGNOSIS — M9905 Segmental and somatic dysfunction of pelvic region: Secondary | ICD-10-CM | POA: Diagnosis not present

## 2020-02-24 DIAGNOSIS — M9904 Segmental and somatic dysfunction of sacral region: Secondary | ICD-10-CM | POA: Diagnosis not present

## 2020-02-24 DIAGNOSIS — M545 Low back pain, unspecified: Secondary | ICD-10-CM | POA: Diagnosis not present

## 2020-02-27 ENCOUNTER — Other Ambulatory Visit: Payer: Self-pay | Admitting: Internal Medicine

## 2020-02-27 DIAGNOSIS — R928 Other abnormal and inconclusive findings on diagnostic imaging of breast: Secondary | ICD-10-CM

## 2020-03-12 DIAGNOSIS — H524 Presbyopia: Secondary | ICD-10-CM | POA: Diagnosis not present

## 2020-03-12 DIAGNOSIS — H35033 Hypertensive retinopathy, bilateral: Secondary | ICD-10-CM | POA: Diagnosis not present

## 2020-03-12 DIAGNOSIS — H25013 Cortical age-related cataract, bilateral: Secondary | ICD-10-CM | POA: Diagnosis not present

## 2020-03-12 DIAGNOSIS — H179 Unspecified corneal scar and opacity: Secondary | ICD-10-CM | POA: Diagnosis not present

## 2020-03-12 DIAGNOSIS — H2513 Age-related nuclear cataract, bilateral: Secondary | ICD-10-CM | POA: Diagnosis not present

## 2020-03-12 LAB — HM DIABETES EYE EXAM

## 2020-03-13 ENCOUNTER — Ambulatory Visit
Admission: RE | Admit: 2020-03-13 | Discharge: 2020-03-13 | Disposition: A | Discharge status: Home / Self Care | Payer: PPO | Source: Ambulatory Visit | Attending: Internal Medicine | Admitting: Internal Medicine

## 2020-03-13 ENCOUNTER — Ambulatory Visit: Payer: PPO

## 2020-03-13 ENCOUNTER — Other Ambulatory Visit: Payer: Self-pay

## 2020-03-13 DIAGNOSIS — R928 Other abnormal and inconclusive findings on diagnostic imaging of breast: Secondary | ICD-10-CM

## 2020-03-17 ENCOUNTER — Encounter: Payer: Self-pay | Admitting: Internal Medicine

## 2020-03-23 ENCOUNTER — Other Ambulatory Visit: Payer: Self-pay | Admitting: Internal Medicine

## 2020-05-13 ENCOUNTER — Telehealth: Payer: Self-pay | Admitting: Internal Medicine

## 2020-05-13 DIAGNOSIS — Z029 Encounter for administrative examinations, unspecified: Secondary | ICD-10-CM

## 2020-05-13 NOTE — Telephone Encounter (Signed)
Faxed Medical Records to [facility] [phone], Phone [phone] 46 pages.  Removed Dr Lenord Fellers as PCP.

## 2022-01-19 ENCOUNTER — Encounter: Payer: Self-pay | Admitting: Gastroenterology
